# Patient Record
Sex: Male | Born: 1955
Health system: Southern US, Community
[De-identification: ages and names within clinical notes are randomized; demographics above are authoritative.]

## PROBLEM LIST (undated history)

## (undated) DIAGNOSIS — M1990 Unspecified osteoarthritis, unspecified site: Secondary | ICD-10-CM

## (undated) DIAGNOSIS — K759 Inflammatory liver disease, unspecified: Secondary | ICD-10-CM

## (undated) DIAGNOSIS — R0600 Dyspnea, unspecified: Secondary | ICD-10-CM

## (undated) DIAGNOSIS — E119 Type 2 diabetes mellitus without complications: Secondary | ICD-10-CM

## (undated) DIAGNOSIS — I10 Essential (primary) hypertension: Secondary | ICD-10-CM

## (undated) DIAGNOSIS — K219 Gastro-esophageal reflux disease without esophagitis: Secondary | ICD-10-CM

## (undated) DIAGNOSIS — Z87442 Personal history of urinary calculi: Secondary | ICD-10-CM

## (undated) DIAGNOSIS — J449 Chronic obstructive pulmonary disease, unspecified: Secondary | ICD-10-CM

## (undated) HISTORY — PX: CHOLECYSTECTOMY: SHX55

## (undated) HISTORY — PX: LIGAMENT REPAIR: SHX5444

## (undated) HISTORY — PX: KNEE ARTHROSCOPY: SHX127

---

## 1995-10-30 HISTORY — PX: NEUROPLASTY / TRANSPOSITION ULNAR NERVE AT ELBOW: SUR895

## 1999-10-30 HISTORY — PX: JOINT REPLACEMENT: SHX530

## 2000-05-15 ENCOUNTER — Encounter: Payer: Self-pay | Admitting: Specialist

## 2000-05-24 ENCOUNTER — Inpatient Hospital Stay (HOSPITAL_COMMUNITY): Admission: RE | Admit: 2000-05-24 | Discharge: 2000-05-27 | Payer: Self-pay | Admitting: Specialist

## 2000-05-24 ENCOUNTER — Encounter: Payer: Self-pay | Admitting: Specialist

## 2000-05-30 ENCOUNTER — Inpatient Hospital Stay (HOSPITAL_COMMUNITY): Admission: EM | Admit: 2000-05-30 | Discharge: 2000-05-31 | Payer: Self-pay | Admitting: Internal Medicine

## 2000-05-30 ENCOUNTER — Encounter: Payer: Self-pay | Admitting: Specialist

## 2000-06-03 ENCOUNTER — Encounter: Payer: Self-pay | Admitting: Specialist

## 2000-06-03 ENCOUNTER — Observation Stay (HOSPITAL_COMMUNITY): Admission: EM | Admit: 2000-06-03 | Discharge: 2000-06-04 | Payer: Self-pay | Admitting: Specialist

## 2000-10-29 HISTORY — PX: BONE GRAFT HIP ILIAC CREST: SUR159

## 2012-04-08 DIAGNOSIS — E538 Deficiency of other specified B group vitamins: Secondary | ICD-10-CM | POA: Diagnosis not present

## 2012-04-08 DIAGNOSIS — G619 Inflammatory polyneuropathy, unspecified: Secondary | ICD-10-CM | POA: Diagnosis not present

## 2012-04-08 DIAGNOSIS — N400 Enlarged prostate without lower urinary tract symptoms: Secondary | ICD-10-CM | POA: Diagnosis not present

## 2012-04-08 DIAGNOSIS — E119 Type 2 diabetes mellitus without complications: Secondary | ICD-10-CM | POA: Diagnosis not present

## 2012-04-08 DIAGNOSIS — M129 Arthropathy, unspecified: Secondary | ICD-10-CM | POA: Diagnosis not present

## 2012-04-10 DIAGNOSIS — M129 Arthropathy, unspecified: Secondary | ICD-10-CM | POA: Diagnosis not present

## 2012-04-10 DIAGNOSIS — G622 Polyneuropathy due to other toxic agents: Secondary | ICD-10-CM | POA: Diagnosis not present

## 2012-04-10 DIAGNOSIS — G2589 Other specified extrapyramidal and movement disorders: Secondary | ICD-10-CM | POA: Diagnosis not present

## 2012-04-10 DIAGNOSIS — E119 Type 2 diabetes mellitus without complications: Secondary | ICD-10-CM | POA: Diagnosis not present

## 2012-04-10 DIAGNOSIS — G619 Inflammatory polyneuropathy, unspecified: Secondary | ICD-10-CM | POA: Diagnosis not present

## 2012-09-29 DIAGNOSIS — L820 Inflamed seborrheic keratosis: Secondary | ICD-10-CM | POA: Diagnosis not present

## 2012-10-10 DIAGNOSIS — Z79899 Other long term (current) drug therapy: Secondary | ICD-10-CM | POA: Diagnosis not present

## 2012-10-10 DIAGNOSIS — E119 Type 2 diabetes mellitus without complications: Secondary | ICD-10-CM | POA: Diagnosis not present

## 2012-10-17 DIAGNOSIS — M129 Arthropathy, unspecified: Secondary | ICD-10-CM | POA: Diagnosis not present

## 2012-10-17 DIAGNOSIS — G2589 Other specified extrapyramidal and movement disorders: Secondary | ICD-10-CM | POA: Diagnosis not present

## 2012-10-17 DIAGNOSIS — G619 Inflammatory polyneuropathy, unspecified: Secondary | ICD-10-CM | POA: Diagnosis not present

## 2012-10-31 DIAGNOSIS — J45901 Unspecified asthma with (acute) exacerbation: Secondary | ICD-10-CM | POA: Diagnosis not present

## 2012-10-31 DIAGNOSIS — J019 Acute sinusitis, unspecified: Secondary | ICD-10-CM | POA: Diagnosis not present

## 2013-01-29 DIAGNOSIS — M23305 Other meniscus derangements, unspecified medial meniscus, unspecified knee: Secondary | ICD-10-CM | POA: Diagnosis not present

## 2013-01-29 DIAGNOSIS — M129 Arthropathy, unspecified: Secondary | ICD-10-CM | POA: Diagnosis not present

## 2013-01-29 DIAGNOSIS — J209 Acute bronchitis, unspecified: Secondary | ICD-10-CM | POA: Diagnosis not present

## 2013-04-07 DIAGNOSIS — G2589 Other specified extrapyramidal and movement disorders: Secondary | ICD-10-CM | POA: Diagnosis not present

## 2013-04-07 DIAGNOSIS — M129 Arthropathy, unspecified: Secondary | ICD-10-CM | POA: Diagnosis not present

## 2013-04-07 DIAGNOSIS — G619 Inflammatory polyneuropathy, unspecified: Secondary | ICD-10-CM | POA: Diagnosis not present

## 2013-04-14 DIAGNOSIS — G4700 Insomnia, unspecified: Secondary | ICD-10-CM | POA: Diagnosis not present

## 2013-04-14 DIAGNOSIS — E119 Type 2 diabetes mellitus without complications: Secondary | ICD-10-CM | POA: Diagnosis not present

## 2013-04-14 DIAGNOSIS — G622 Polyneuropathy due to other toxic agents: Secondary | ICD-10-CM | POA: Diagnosis not present

## 2013-04-14 DIAGNOSIS — M129 Arthropathy, unspecified: Secondary | ICD-10-CM | POA: Diagnosis not present

## 2013-04-14 DIAGNOSIS — G619 Inflammatory polyneuropathy, unspecified: Secondary | ICD-10-CM | POA: Diagnosis not present

## 2013-04-22 DIAGNOSIS — M129 Arthropathy, unspecified: Secondary | ICD-10-CM | POA: Diagnosis not present

## 2013-04-22 DIAGNOSIS — M23305 Other meniscus derangements, unspecified medial meniscus, unspecified knee: Secondary | ICD-10-CM | POA: Diagnosis not present

## 2013-09-04 DIAGNOSIS — F17200 Nicotine dependence, unspecified, uncomplicated: Secondary | ICD-10-CM | POA: Diagnosis not present

## 2013-09-04 DIAGNOSIS — J209 Acute bronchitis, unspecified: Secondary | ICD-10-CM | POA: Diagnosis not present

## 2013-09-04 DIAGNOSIS — S93409A Sprain of unspecified ligament of unspecified ankle, initial encounter: Secondary | ICD-10-CM | POA: Diagnosis not present

## 2013-09-04 DIAGNOSIS — J019 Acute sinusitis, unspecified: Secondary | ICD-10-CM | POA: Diagnosis not present

## 2013-10-20 DIAGNOSIS — E119 Type 2 diabetes mellitus without complications: Secondary | ICD-10-CM | POA: Diagnosis not present

## 2013-10-20 DIAGNOSIS — G619 Inflammatory polyneuropathy, unspecified: Secondary | ICD-10-CM | POA: Diagnosis not present

## 2013-11-09 DIAGNOSIS — M1990 Unspecified osteoarthritis, unspecified site: Secondary | ICD-10-CM | POA: Diagnosis not present

## 2013-11-09 DIAGNOSIS — G5620 Lesion of ulnar nerve, unspecified upper limb: Secondary | ICD-10-CM | POA: Diagnosis not present

## 2013-11-09 DIAGNOSIS — M25569 Pain in unspecified knee: Secondary | ICD-10-CM | POA: Diagnosis not present

## 2013-12-16 DIAGNOSIS — G5620 Lesion of ulnar nerve, unspecified upper limb: Secondary | ICD-10-CM | POA: Diagnosis not present

## 2013-12-16 DIAGNOSIS — Z79899 Other long term (current) drug therapy: Secondary | ICD-10-CM | POA: Diagnosis not present

## 2013-12-16 DIAGNOSIS — M1990 Unspecified osteoarthritis, unspecified site: Secondary | ICD-10-CM | POA: Diagnosis not present

## 2013-12-16 DIAGNOSIS — M25569 Pain in unspecified knee: Secondary | ICD-10-CM | POA: Diagnosis not present

## 2013-12-29 DIAGNOSIS — M722 Plantar fascial fibromatosis: Secondary | ICD-10-CM | POA: Diagnosis not present

## 2013-12-29 DIAGNOSIS — J45901 Unspecified asthma with (acute) exacerbation: Secondary | ICD-10-CM | POA: Diagnosis not present

## 2014-03-23 DIAGNOSIS — M1990 Unspecified osteoarthritis, unspecified site: Secondary | ICD-10-CM | POA: Diagnosis not present

## 2014-03-23 DIAGNOSIS — Z79899 Other long term (current) drug therapy: Secondary | ICD-10-CM | POA: Diagnosis not present

## 2014-03-23 DIAGNOSIS — M25569 Pain in unspecified knee: Secondary | ICD-10-CM | POA: Diagnosis not present

## 2014-03-23 DIAGNOSIS — G5620 Lesion of ulnar nerve, unspecified upper limb: Secondary | ICD-10-CM | POA: Diagnosis not present

## 2014-04-14 DIAGNOSIS — IMO0001 Reserved for inherently not codable concepts without codable children: Secondary | ICD-10-CM | POA: Diagnosis not present

## 2014-04-14 DIAGNOSIS — F17200 Nicotine dependence, unspecified, uncomplicated: Secondary | ICD-10-CM | POA: Diagnosis not present

## 2014-04-21 DIAGNOSIS — G622 Polyneuropathy due to other toxic agents: Secondary | ICD-10-CM | POA: Diagnosis not present

## 2014-04-21 DIAGNOSIS — F17200 Nicotine dependence, unspecified, uncomplicated: Secondary | ICD-10-CM | POA: Diagnosis not present

## 2014-04-21 DIAGNOSIS — E119 Type 2 diabetes mellitus without complications: Secondary | ICD-10-CM | POA: Diagnosis not present

## 2014-04-21 DIAGNOSIS — G619 Inflammatory polyneuropathy, unspecified: Secondary | ICD-10-CM | POA: Diagnosis not present

## 2014-08-04 DIAGNOSIS — M1990 Unspecified osteoarthritis, unspecified site: Secondary | ICD-10-CM | POA: Diagnosis not present

## 2014-08-04 DIAGNOSIS — Z79899 Other long term (current) drug therapy: Secondary | ICD-10-CM | POA: Diagnosis not present

## 2014-08-04 DIAGNOSIS — G5620 Lesion of ulnar nerve, unspecified upper limb: Secondary | ICD-10-CM | POA: Diagnosis not present

## 2014-08-04 DIAGNOSIS — M25569 Pain in unspecified knee: Secondary | ICD-10-CM | POA: Diagnosis not present

## 2014-08-21 DIAGNOSIS — M23203 Derangement of unspecified medial meniscus due to old tear or injury, right knee: Secondary | ICD-10-CM | POA: Diagnosis not present

## 2014-08-21 DIAGNOSIS — M179 Osteoarthritis of knee, unspecified: Secondary | ICD-10-CM | POA: Diagnosis not present

## 2014-10-08 DIAGNOSIS — J209 Acute bronchitis, unspecified: Secondary | ICD-10-CM | POA: Diagnosis not present

## 2014-10-08 DIAGNOSIS — J0111 Acute recurrent frontal sinusitis: Secondary | ICD-10-CM | POA: Diagnosis not present

## 2014-10-13 DIAGNOSIS — G259 Extrapyramidal and movement disorder, unspecified: Secondary | ICD-10-CM | POA: Diagnosis not present

## 2014-10-13 DIAGNOSIS — Z720 Tobacco use: Secondary | ICD-10-CM | POA: Diagnosis not present

## 2014-10-13 DIAGNOSIS — E119 Type 2 diabetes mellitus without complications: Secondary | ICD-10-CM | POA: Diagnosis not present

## 2014-10-13 DIAGNOSIS — M1990 Unspecified osteoarthritis, unspecified site: Secondary | ICD-10-CM | POA: Diagnosis not present

## 2014-10-13 DIAGNOSIS — G619 Inflammatory polyneuropathy, unspecified: Secondary | ICD-10-CM | POA: Diagnosis not present

## 2014-10-20 DIAGNOSIS — E119 Type 2 diabetes mellitus without complications: Secondary | ICD-10-CM | POA: Diagnosis not present

## 2014-10-20 DIAGNOSIS — Z720 Tobacco use: Secondary | ICD-10-CM | POA: Diagnosis not present

## 2014-10-20 DIAGNOSIS — G619 Inflammatory polyneuropathy, unspecified: Secondary | ICD-10-CM | POA: Diagnosis not present

## 2014-10-20 DIAGNOSIS — M1990 Unspecified osteoarthritis, unspecified site: Secondary | ICD-10-CM | POA: Diagnosis not present

## 2014-11-30 DIAGNOSIS — Z79891 Long term (current) use of opiate analgesic: Secondary | ICD-10-CM | POA: Diagnosis not present

## 2015-01-12 DIAGNOSIS — M25569 Pain in unspecified knee: Secondary | ICD-10-CM | POA: Diagnosis not present

## 2015-01-12 DIAGNOSIS — M47816 Spondylosis without myelopathy or radiculopathy, lumbar region: Secondary | ICD-10-CM | POA: Diagnosis not present

## 2015-01-12 DIAGNOSIS — G5620 Lesion of ulnar nerve, unspecified upper limb: Secondary | ICD-10-CM | POA: Diagnosis not present

## 2015-01-12 DIAGNOSIS — M1990 Unspecified osteoarthritis, unspecified site: Secondary | ICD-10-CM | POA: Diagnosis not present

## 2015-04-07 DIAGNOSIS — E119 Type 2 diabetes mellitus without complications: Secondary | ICD-10-CM | POA: Diagnosis not present

## 2015-04-12 DIAGNOSIS — G619 Inflammatory polyneuropathy, unspecified: Secondary | ICD-10-CM | POA: Diagnosis not present

## 2015-04-12 DIAGNOSIS — Z720 Tobacco use: Secondary | ICD-10-CM | POA: Diagnosis not present

## 2015-04-12 DIAGNOSIS — Z1389 Encounter for screening for other disorder: Secondary | ICD-10-CM | POA: Diagnosis not present

## 2015-04-12 DIAGNOSIS — E119 Type 2 diabetes mellitus without complications: Secondary | ICD-10-CM | POA: Diagnosis not present

## 2015-05-09 DIAGNOSIS — G5620 Lesion of ulnar nerve, unspecified upper limb: Secondary | ICD-10-CM | POA: Diagnosis not present

## 2015-05-09 DIAGNOSIS — M47816 Spondylosis without myelopathy or radiculopathy, lumbar region: Secondary | ICD-10-CM | POA: Diagnosis not present

## 2015-05-09 DIAGNOSIS — M1990 Unspecified osteoarthritis, unspecified site: Secondary | ICD-10-CM | POA: Diagnosis not present

## 2015-09-05 DIAGNOSIS — M1990 Unspecified osteoarthritis, unspecified site: Secondary | ICD-10-CM | POA: Diagnosis not present

## 2015-09-05 DIAGNOSIS — M47816 Spondylosis without myelopathy or radiculopathy, lumbar region: Secondary | ICD-10-CM | POA: Diagnosis not present

## 2015-10-04 DIAGNOSIS — E119 Type 2 diabetes mellitus without complications: Secondary | ICD-10-CM | POA: Diagnosis not present

## 2015-10-04 DIAGNOSIS — G619 Inflammatory polyneuropathy, unspecified: Secondary | ICD-10-CM | POA: Diagnosis not present

## 2015-10-04 DIAGNOSIS — Z720 Tobacco use: Secondary | ICD-10-CM | POA: Diagnosis not present

## 2015-11-02 DIAGNOSIS — R05 Cough: Secondary | ICD-10-CM | POA: Diagnosis not present

## 2015-11-02 DIAGNOSIS — R0981 Nasal congestion: Secondary | ICD-10-CM | POA: Diagnosis not present

## 2016-01-05 DIAGNOSIS — Z79891 Long term (current) use of opiate analgesic: Secondary | ICD-10-CM | POA: Diagnosis not present

## 2016-01-18 DIAGNOSIS — M1990 Unspecified osteoarthritis, unspecified site: Secondary | ICD-10-CM | POA: Diagnosis not present

## 2016-01-18 DIAGNOSIS — Z79899 Other long term (current) drug therapy: Secondary | ICD-10-CM | POA: Diagnosis not present

## 2016-01-18 DIAGNOSIS — G5620 Lesion of ulnar nerve, unspecified upper limb: Secondary | ICD-10-CM | POA: Diagnosis not present

## 2016-01-18 DIAGNOSIS — M25569 Pain in unspecified knee: Secondary | ICD-10-CM | POA: Diagnosis not present

## 2016-03-28 DIAGNOSIS — M25461 Effusion, right knee: Secondary | ICD-10-CM | POA: Diagnosis not present

## 2016-03-28 DIAGNOSIS — M23203 Derangement of unspecified medial meniscus due to old tear or injury, right knee: Secondary | ICD-10-CM | POA: Diagnosis not present

## 2016-03-28 DIAGNOSIS — D1621 Benign neoplasm of long bones of right lower limb: Secondary | ICD-10-CM | POA: Diagnosis not present

## 2016-03-29 DIAGNOSIS — M1990 Unspecified osteoarthritis, unspecified site: Secondary | ICD-10-CM | POA: Diagnosis not present

## 2016-03-29 DIAGNOSIS — E119 Type 2 diabetes mellitus without complications: Secondary | ICD-10-CM | POA: Diagnosis not present

## 2016-03-29 DIAGNOSIS — Z720 Tobacco use: Secondary | ICD-10-CM | POA: Diagnosis not present

## 2016-03-29 DIAGNOSIS — G259 Extrapyramidal and movement disorder, unspecified: Secondary | ICD-10-CM | POA: Diagnosis not present

## 2016-04-03 DIAGNOSIS — M859 Disorder of bone density and structure, unspecified: Secondary | ICD-10-CM | POA: Diagnosis not present

## 2016-04-03 DIAGNOSIS — D1621 Benign neoplasm of long bones of right lower limb: Secondary | ICD-10-CM | POA: Diagnosis not present

## 2016-04-05 DIAGNOSIS — Z720 Tobacco use: Secondary | ICD-10-CM | POA: Diagnosis not present

## 2016-04-05 DIAGNOSIS — M1990 Unspecified osteoarthritis, unspecified site: Secondary | ICD-10-CM | POA: Diagnosis not present

## 2016-04-05 DIAGNOSIS — E119 Type 2 diabetes mellitus without complications: Secondary | ICD-10-CM | POA: Diagnosis not present

## 2016-04-05 DIAGNOSIS — G619 Inflammatory polyneuropathy, unspecified: Secondary | ICD-10-CM | POA: Diagnosis not present

## 2016-06-26 DIAGNOSIS — M25775 Osteophyte, left foot: Secondary | ICD-10-CM | POA: Diagnosis not present

## 2016-06-26 DIAGNOSIS — L600 Ingrowing nail: Secondary | ICD-10-CM | POA: Diagnosis not present

## 2016-07-25 DIAGNOSIS — G8929 Other chronic pain: Secondary | ICD-10-CM | POA: Diagnosis not present

## 2016-07-25 DIAGNOSIS — Z79899 Other long term (current) drug therapy: Secondary | ICD-10-CM | POA: Diagnosis not present

## 2016-07-25 DIAGNOSIS — M25522 Pain in left elbow: Secondary | ICD-10-CM | POA: Diagnosis not present

## 2016-08-20 DIAGNOSIS — Z79891 Long term (current) use of opiate analgesic: Secondary | ICD-10-CM | POA: Diagnosis not present

## 2016-08-20 DIAGNOSIS — M25522 Pain in left elbow: Secondary | ICD-10-CM | POA: Diagnosis not present

## 2016-08-20 DIAGNOSIS — G8929 Other chronic pain: Secondary | ICD-10-CM | POA: Diagnosis not present

## 2016-09-17 DIAGNOSIS — Z79891 Long term (current) use of opiate analgesic: Secondary | ICD-10-CM | POA: Diagnosis not present

## 2016-09-17 DIAGNOSIS — M25522 Pain in left elbow: Secondary | ICD-10-CM | POA: Diagnosis not present

## 2016-09-17 DIAGNOSIS — G8929 Other chronic pain: Secondary | ICD-10-CM | POA: Diagnosis not present

## 2016-10-15 DIAGNOSIS — Z79891 Long term (current) use of opiate analgesic: Secondary | ICD-10-CM | POA: Diagnosis not present

## 2016-10-15 DIAGNOSIS — G8929 Other chronic pain: Secondary | ICD-10-CM | POA: Diagnosis not present

## 2016-10-15 DIAGNOSIS — M25522 Pain in left elbow: Secondary | ICD-10-CM | POA: Diagnosis not present

## 2016-11-19 DIAGNOSIS — Z79891 Long term (current) use of opiate analgesic: Secondary | ICD-10-CM | POA: Diagnosis not present

## 2016-11-19 DIAGNOSIS — M25522 Pain in left elbow: Secondary | ICD-10-CM | POA: Diagnosis not present

## 2016-11-19 DIAGNOSIS — G8929 Other chronic pain: Secondary | ICD-10-CM | POA: Diagnosis not present

## 2017-01-09 DIAGNOSIS — M25461 Effusion, right knee: Secondary | ICD-10-CM | POA: Diagnosis not present

## 2017-01-09 DIAGNOSIS — M5416 Radiculopathy, lumbar region: Secondary | ICD-10-CM | POA: Diagnosis not present

## 2017-01-09 DIAGNOSIS — Z6827 Body mass index (BMI) 27.0-27.9, adult: Secondary | ICD-10-CM | POA: Diagnosis not present

## 2017-01-09 DIAGNOSIS — M23203 Derangement of unspecified medial meniscus due to old tear or injury, right knee: Secondary | ICD-10-CM | POA: Diagnosis not present

## 2017-01-14 DIAGNOSIS — M150 Primary generalized (osteo)arthritis: Secondary | ICD-10-CM | POA: Diagnosis not present

## 2017-01-14 DIAGNOSIS — E1142 Type 2 diabetes mellitus with diabetic polyneuropathy: Secondary | ICD-10-CM | POA: Diagnosis not present

## 2017-01-14 DIAGNOSIS — Z79891 Long term (current) use of opiate analgesic: Secondary | ICD-10-CM | POA: Diagnosis not present

## 2017-01-14 DIAGNOSIS — G5622 Lesion of ulnar nerve, left upper limb: Secondary | ICD-10-CM | POA: Diagnosis not present

## 2017-01-14 DIAGNOSIS — M47817 Spondylosis without myelopathy or radiculopathy, lumbosacral region: Secondary | ICD-10-CM | POA: Diagnosis not present

## 2017-01-14 DIAGNOSIS — G894 Chronic pain syndrome: Secondary | ICD-10-CM | POA: Diagnosis not present

## 2017-02-13 DIAGNOSIS — Z79891 Long term (current) use of opiate analgesic: Secondary | ICD-10-CM | POA: Diagnosis not present

## 2017-03-04 DIAGNOSIS — Z720 Tobacco use: Secondary | ICD-10-CM | POA: Diagnosis not present

## 2017-03-04 DIAGNOSIS — Z6827 Body mass index (BMI) 27.0-27.9, adult: Secondary | ICD-10-CM | POA: Diagnosis not present

## 2017-03-04 DIAGNOSIS — J209 Acute bronchitis, unspecified: Secondary | ICD-10-CM | POA: Diagnosis not present

## 2017-03-04 DIAGNOSIS — J0111 Acute recurrent frontal sinusitis: Secondary | ICD-10-CM | POA: Diagnosis not present

## 2017-03-19 DIAGNOSIS — Z79891 Long term (current) use of opiate analgesic: Secondary | ICD-10-CM | POA: Diagnosis not present

## 2017-03-19 DIAGNOSIS — M47817 Spondylosis without myelopathy or radiculopathy, lumbosacral region: Secondary | ICD-10-CM | POA: Diagnosis not present

## 2017-03-19 DIAGNOSIS — E1142 Type 2 diabetes mellitus with diabetic polyneuropathy: Secondary | ICD-10-CM | POA: Diagnosis not present

## 2017-03-19 DIAGNOSIS — M150 Primary generalized (osteo)arthritis: Secondary | ICD-10-CM | POA: Diagnosis not present

## 2017-03-19 DIAGNOSIS — G5622 Lesion of ulnar nerve, left upper limb: Secondary | ICD-10-CM | POA: Diagnosis not present

## 2017-03-19 DIAGNOSIS — G8929 Other chronic pain: Secondary | ICD-10-CM | POA: Diagnosis not present

## 2017-04-04 DIAGNOSIS — G619 Inflammatory polyneuropathy, unspecified: Secondary | ICD-10-CM | POA: Diagnosis not present

## 2017-04-04 DIAGNOSIS — E119 Type 2 diabetes mellitus without complications: Secondary | ICD-10-CM | POA: Diagnosis not present

## 2017-04-04 DIAGNOSIS — D519 Vitamin B12 deficiency anemia, unspecified: Secondary | ICD-10-CM | POA: Diagnosis not present

## 2017-04-04 DIAGNOSIS — M1990 Unspecified osteoarthritis, unspecified site: Secondary | ICD-10-CM | POA: Diagnosis not present

## 2017-04-04 DIAGNOSIS — Z720 Tobacco use: Secondary | ICD-10-CM | POA: Diagnosis not present

## 2017-04-08 DIAGNOSIS — Z6827 Body mass index (BMI) 27.0-27.9, adult: Secondary | ICD-10-CM | POA: Diagnosis not present

## 2017-04-08 DIAGNOSIS — M1711 Unilateral primary osteoarthritis, right knee: Secondary | ICD-10-CM | POA: Diagnosis not present

## 2017-04-08 DIAGNOSIS — M23203 Derangement of unspecified medial meniscus due to old tear or injury, right knee: Secondary | ICD-10-CM | POA: Diagnosis not present

## 2017-04-09 DIAGNOSIS — Z0001 Encounter for general adult medical examination with abnormal findings: Secondary | ICD-10-CM | POA: Diagnosis not present

## 2017-04-09 DIAGNOSIS — E119 Type 2 diabetes mellitus without complications: Secondary | ICD-10-CM | POA: Diagnosis not present

## 2017-04-09 DIAGNOSIS — E7800 Pure hypercholesterolemia, unspecified: Secondary | ICD-10-CM | POA: Diagnosis not present

## 2017-04-09 DIAGNOSIS — M545 Low back pain: Secondary | ICD-10-CM | POA: Diagnosis not present

## 2017-04-09 DIAGNOSIS — Z6826 Body mass index (BMI) 26.0-26.9, adult: Secondary | ICD-10-CM | POA: Diagnosis not present

## 2017-06-11 DIAGNOSIS — M545 Low back pain: Secondary | ICD-10-CM | POA: Diagnosis not present

## 2017-06-11 DIAGNOSIS — M25522 Pain in left elbow: Secondary | ICD-10-CM | POA: Diagnosis not present

## 2017-06-11 DIAGNOSIS — Z79899 Other long term (current) drug therapy: Secondary | ICD-10-CM | POA: Diagnosis not present

## 2017-06-11 DIAGNOSIS — M79631 Pain in right forearm: Secondary | ICD-10-CM | POA: Diagnosis not present

## 2017-06-29 DIAGNOSIS — E119 Type 2 diabetes mellitus without complications: Secondary | ICD-10-CM | POA: Diagnosis not present

## 2017-06-29 DIAGNOSIS — L255 Unspecified contact dermatitis due to plants, except food: Secondary | ICD-10-CM | POA: Diagnosis not present

## 2017-06-29 DIAGNOSIS — Z6828 Body mass index (BMI) 28.0-28.9, adult: Secondary | ICD-10-CM | POA: Diagnosis not present

## 2017-07-09 DIAGNOSIS — M545 Low back pain: Secondary | ICD-10-CM | POA: Diagnosis not present

## 2017-07-09 DIAGNOSIS — M25551 Pain in right hip: Secondary | ICD-10-CM | POA: Diagnosis not present

## 2017-07-09 DIAGNOSIS — Z6828 Body mass index (BMI) 28.0-28.9, adult: Secondary | ICD-10-CM | POA: Diagnosis not present

## 2017-07-15 DIAGNOSIS — M79631 Pain in right forearm: Secondary | ICD-10-CM | POA: Diagnosis not present

## 2017-07-15 DIAGNOSIS — M545 Low back pain: Secondary | ICD-10-CM | POA: Diagnosis not present

## 2017-07-15 DIAGNOSIS — E119 Type 2 diabetes mellitus without complications: Secondary | ICD-10-CM | POA: Diagnosis not present

## 2017-07-15 DIAGNOSIS — M25522 Pain in left elbow: Secondary | ICD-10-CM | POA: Diagnosis not present

## 2017-07-15 DIAGNOSIS — Z79899 Other long term (current) drug therapy: Secondary | ICD-10-CM | POA: Diagnosis not present

## 2017-08-13 DIAGNOSIS — Z79899 Other long term (current) drug therapy: Secondary | ICD-10-CM | POA: Diagnosis not present

## 2017-08-13 DIAGNOSIS — M545 Low back pain: Secondary | ICD-10-CM | POA: Diagnosis not present

## 2017-08-16 DIAGNOSIS — Z6827 Body mass index (BMI) 27.0-27.9, adult: Secondary | ICD-10-CM | POA: Diagnosis not present

## 2017-08-16 DIAGNOSIS — A084 Viral intestinal infection, unspecified: Secondary | ICD-10-CM | POA: Diagnosis not present

## 2017-08-29 DIAGNOSIS — Z1389 Encounter for screening for other disorder: Secondary | ICD-10-CM | POA: Diagnosis not present

## 2017-08-29 DIAGNOSIS — M25551 Pain in right hip: Secondary | ICD-10-CM | POA: Diagnosis not present

## 2017-08-29 DIAGNOSIS — Z6828 Body mass index (BMI) 28.0-28.9, adult: Secondary | ICD-10-CM | POA: Diagnosis not present

## 2017-08-29 DIAGNOSIS — M545 Low back pain: Secondary | ICD-10-CM | POA: Diagnosis not present

## 2017-09-10 DIAGNOSIS — M25551 Pain in right hip: Secondary | ICD-10-CM | POA: Diagnosis not present

## 2017-09-10 DIAGNOSIS — M5416 Radiculopathy, lumbar region: Secondary | ICD-10-CM | POA: Diagnosis not present

## 2017-09-10 DIAGNOSIS — Z96641 Presence of right artificial hip joint: Secondary | ICD-10-CM | POA: Diagnosis not present

## 2017-09-17 DIAGNOSIS — M5416 Radiculopathy, lumbar region: Secondary | ICD-10-CM | POA: Diagnosis not present

## 2017-09-23 DIAGNOSIS — M5416 Radiculopathy, lumbar region: Secondary | ICD-10-CM | POA: Diagnosis not present

## 2017-10-11 DIAGNOSIS — M5416 Radiculopathy, lumbar region: Secondary | ICD-10-CM | POA: Diagnosis not present

## 2017-10-11 DIAGNOSIS — M48061 Spinal stenosis, lumbar region without neurogenic claudication: Secondary | ICD-10-CM | POA: Diagnosis not present

## 2017-10-14 ENCOUNTER — Other Ambulatory Visit: Payer: Self-pay | Admitting: Orthopedic Surgery

## 2017-10-14 DIAGNOSIS — M5416 Radiculopathy, lumbar region: Secondary | ICD-10-CM

## 2017-10-30 ENCOUNTER — Ambulatory Visit
Admission: RE | Admit: 2017-10-30 | Discharge: 2017-10-30 | Disposition: A | Discharge status: Home / Self Care | Payer: Medicare Other | Source: Ambulatory Visit | Attending: Orthopedic Surgery | Admitting: Orthopedic Surgery

## 2017-10-30 ENCOUNTER — Other Ambulatory Visit: Payer: Self-pay | Admitting: Orthopedic Surgery

## 2017-10-30 ENCOUNTER — Ambulatory Visit
Admission: RE | Admit: 2017-10-30 | Discharge: 2017-10-30 | Disposition: A | Discharge status: Home / Self Care | Payer: Self-pay | Source: Ambulatory Visit | Attending: Orthopedic Surgery | Admitting: Orthopedic Surgery

## 2017-10-30 DIAGNOSIS — M5416 Radiculopathy, lumbar region: Secondary | ICD-10-CM

## 2017-10-30 DIAGNOSIS — R52 Pain, unspecified: Secondary | ICD-10-CM

## 2017-10-30 DIAGNOSIS — M5126 Other intervertebral disc displacement, lumbar region: Secondary | ICD-10-CM | POA: Diagnosis not present

## 2017-10-30 MED ORDER — DIAZEPAM 5 MG PO TABS
10.0000 mg | ORAL_TABLET | Freq: Once | ORAL | Status: AC
  Administered 2017-10-30: 10 mg via ORAL

## 2017-10-30 MED ORDER — IOPAMIDOL (ISOVUE-M 200) INJECTION 41%
15.0000 mL | Freq: Once | INTRAMUSCULAR | Status: AC
  Administered 2017-10-30: 15 mL via INTRATHECAL

## 2017-10-30 NOTE — Discharge Instructions (Signed)
Myelogram Discharge Instructions  1. Go home and rest quietly for the next 24 hours.  It is important to lie flat for the next 24 hours.  Get up only to go to the restroom.  You may lie in the bed or on a couch on your back, your stomach, your left side or your right side.  You may have one pillow under your head.  You may have pillows between your knees while you are on your side or under your knees while you are on your back.  2. DO NOT drive today.  Recline the seat as far back as it will go, while still wearing your seat belt, on the way home.  3. You may get up to go to the bathroom as needed.  You may sit up for 10 minutes to eat.  You may resume your normal diet and medications unless otherwise indicated.  Drink lots of extra fluids today and tomorrow.  4. The incidence of headache, nausea, or vomiting is about 5% (one in 20 patients).  If you develop a headache, lie flat and drink plenty of fluids until the headache goes away.  Caffeinated beverages may be helpful.  If you develop severe nausea and vomiting or a headache that does not go away with flat bed rest, call [phone].  5. You may resume normal activities after your 24 hours of bed rest is over; however, do not exert yourself strongly or do any heavy lifting tomorrow. If when you get up you have a headache when standing, go back to bed and force fluids for another 24 hours.  6. Call your physician for a follow-up appointment.  The results of your myelogram will be sent directly to your physician by the following day.  7. If you have any questions or if complications develop after you arrive home, please call [phone].  Discharge instructions have been explained to the patient.  The patient, or the person responsible for the patient, fully understands these instructions  YOU MAY RESTART YOUR TRAMADOL TOMORROW 10/31/2016 AT 10:30AM.

## 2017-11-04 DIAGNOSIS — M48061 Spinal stenosis, lumbar region without neurogenic claudication: Secondary | ICD-10-CM | POA: Diagnosis not present

## 2017-11-19 DIAGNOSIS — M47816 Spondylosis without myelopathy or radiculopathy, lumbar region: Secondary | ICD-10-CM | POA: Diagnosis not present

## 2017-12-03 DIAGNOSIS — M7138 Other bursal cyst, other site: Secondary | ICD-10-CM | POA: Diagnosis not present

## 2017-12-16 DIAGNOSIS — M5416 Radiculopathy, lumbar region: Secondary | ICD-10-CM | POA: Diagnosis not present

## 2017-12-16 DIAGNOSIS — M48061 Spinal stenosis, lumbar region without neurogenic claudication: Secondary | ICD-10-CM | POA: Diagnosis not present

## 2017-12-16 DIAGNOSIS — G5622 Lesion of ulnar nerve, left upper limb: Secondary | ICD-10-CM | POA: Diagnosis not present

## 2017-12-16 DIAGNOSIS — G894 Chronic pain syndrome: Secondary | ICD-10-CM | POA: Diagnosis not present

## 2017-12-26 DIAGNOSIS — M5416 Radiculopathy, lumbar region: Secondary | ICD-10-CM | POA: Diagnosis not present

## 2017-12-31 DIAGNOSIS — M5416 Radiculopathy, lumbar region: Secondary | ICD-10-CM | POA: Diagnosis not present

## 2017-12-31 DIAGNOSIS — M48061 Spinal stenosis, lumbar region without neurogenic claudication: Secondary | ICD-10-CM | POA: Diagnosis not present

## 2018-01-21 DIAGNOSIS — M48061 Spinal stenosis, lumbar region without neurogenic claudication: Secondary | ICD-10-CM | POA: Diagnosis not present

## 2018-01-21 DIAGNOSIS — M5416 Radiculopathy, lumbar region: Secondary | ICD-10-CM | POA: Diagnosis not present

## 2018-02-18 DIAGNOSIS — F17200 Nicotine dependence, unspecified, uncomplicated: Secondary | ICD-10-CM | POA: Diagnosis not present

## 2018-02-18 DIAGNOSIS — J449 Chronic obstructive pulmonary disease, unspecified: Secondary | ICD-10-CM | POA: Diagnosis not present

## 2018-02-18 DIAGNOSIS — I10 Essential (primary) hypertension: Secondary | ICD-10-CM | POA: Diagnosis not present

## 2018-02-18 DIAGNOSIS — M545 Low back pain: Secondary | ICD-10-CM | POA: Diagnosis not present

## 2018-02-24 DIAGNOSIS — M5416 Radiculopathy, lumbar region: Secondary | ICD-10-CM | POA: Diagnosis not present

## 2018-02-25 ENCOUNTER — Ambulatory Visit (HOSPITAL_COMMUNITY)
Admission: RE | Admit: 2018-02-25 | Discharge: 2018-02-25 | Disposition: A | Discharge status: Home / Self Care | Payer: Medicare Other | Source: Ambulatory Visit | Attending: Pulmonary Disease | Admitting: Pulmonary Disease

## 2018-02-25 ENCOUNTER — Other Ambulatory Visit (HOSPITAL_COMMUNITY): Payer: Self-pay | Admitting: Pulmonary Disease

## 2018-02-25 DIAGNOSIS — R0602 Shortness of breath: Secondary | ICD-10-CM | POA: Diagnosis not present

## 2018-02-25 DIAGNOSIS — R05 Cough: Secondary | ICD-10-CM | POA: Diagnosis not present

## 2018-02-25 DIAGNOSIS — J449 Chronic obstructive pulmonary disease, unspecified: Secondary | ICD-10-CM

## 2018-03-05 NOTE — Patient Instructions (Addendum)
ROCH QUACH  03/05/2018   Your procedure is scheduled on: 03-12-18  Report to [facility] Main  Entrance    Report to admitting at 6:30 AM    Call this number if you have problems the morning of surgery [phone]   Remember: Do not eat food or drink liquids :After Midnight.     Take these medicines the morning of surgery with A SIP OF WATER: None. You may bring and use your inhaler as needed.   DO NOT TAKE ANY DIABETIC MEDICATIONS DAY OF YOUR SURGERY                               You may not have any metal on your body including hair pins and              piercings  Do not wear jewelry, lotions, powders or  deodorant             Men may shave face and neck.   Do not bring valuables to the hospital. [place].  Contacts, dentures or bridgework may not be worn into surgery.  Leave suitcase in the car. After surgery it may be brought to your room.    Special Instructions: N/A              Please read over the following fact sheets you were given: _____________________________________________________________________ How to Manage Your Diabetes Before and After Surgery  Why is it important to control my blood sugar before and after surgery? . Improving blood sugar levels before and after surgery helps healing and can limit problems. . A way of improving blood sugar control is eating a healthy diet by: o  Eating less sugar and carbohydrates o  Increasing activity/exercise o  Talking with your doctor about reaching your blood sugar goals . High blood sugars (greater than 180 mg/dL) can raise your risk of infections and slow your recovery, so you will need to focus on controlling your diabetes during the weeks before surgery. . Make sure that the doctor who takes care of your diabetes knows about your planned surgery including the date and location.  How do I manage my blood sugar before  surgery? . Check your blood sugar at least 4 times a day, starting 2 days before surgery, to make sure that the level is not too high or low. o Check your blood sugar the morning of your surgery when you wake up and every 2 hours until you get to the Short Stay unit. . If your blood sugar is less than 70 mg/dL, you will need to treat for low blood sugar: o Do not take insulin. o Treat a low blood sugar (less than 70 mg/dL) with  cup of clear juice (cranberry or apple), 4 glucose tablets, OR glucose gel. o Recheck blood sugar in 15 minutes after treatment (to make sure it is greater than 70 mg/dL). If your blood sugar is not greater than 70 mg/dL on recheck, call [phone] for further instructions. . Report your blood sugar to the short stay nurse when you get to Short Stay.  . If you are admitted to the hospital after surgery: o Your blood sugar will be checked by the staff and you will  probably be given insulin after surgery (instead of oral diabetes medicines) to make sure you have good blood sugar levels. o The goal for blood sugar control after surgery is 80-180 mg/dL.   WHAT DO I DO ABOUT MY DIABETES MEDICATION?  Marland Kitchen Do not take oral diabetes medicines (pills) the morning of surgery.  . THE DAY BEFORE SURGERY, take your usual dose of Metformin. Take only your morning/lunch dose of Glipizide.       Reviewed and Endorsed by [facility] Patient Education Committee, August 2015           [facility] - Preparing for Surgery Before surgery, you can play an important role.  Because skin is not sterile, your skin needs to be as free of germs as possible.  You can reduce the number of germs on your skin by washing with CHG (chlorahexidine gluconate) soap before surgery.  CHG is an antiseptic cleaner which kills germs and bonds with the skin to continue killing germs even after washing. Please DO NOT use if you have an allergy to CHG or antibacterial soaps.  If your skin becomes  reddened/irritated stop using the CHG and inform your nurse when you arrive at Short Stay. Do not shave (including legs and underarms) for at least 48 hours prior to the first CHG shower.  You may shave your face/neck. Please follow these instructions carefully:  1.  Shower with CHG Soap the night before surgery and the  morning of Surgery.  2.  If you choose to wash your hair, wash your hair first as usual with your  normal  shampoo.  3.  After you shampoo, rinse your hair and body thoroughly to remove the  shampoo.                           4.  Use CHG as you would any other liquid soap.  You can apply chg directly  to the skin and wash                       Gently with a scrungie or clean washcloth.  5.  Apply the CHG Soap to your body ONLY FROM THE NECK DOWN.   Do not use on face/ open                           Wound or open sores. Avoid contact with eyes, ears mouth and genitals (private parts).                       Wash face,  Genitals (private parts) with your normal soap.             6.  Wash thoroughly, paying special attention to the area where your surgery  will be performed.  7.  Thoroughly rinse your body with warm water from the neck down.  8.  DO NOT shower/wash with your normal soap after using and rinsing off  the CHG Soap.                9.  Pat yourself dry with a clean towel.            10.  Wear clean pajamas.            11.  Place clean sheets on your bed the night of your first shower and do not  sleep with pets.  Day of Surgery : Do not apply any lotions/deodorants the morning of surgery.  Please wear clean clothes to the hospital/surgery center.  FAILURE TO FOLLOW THESE INSTRUCTIONS MAY RESULT IN THE CANCELLATION OF YOUR SURGERY PATIENT SIGNATURE_________________________________  NURSE SIGNATURE__________________________________  ________________________________________________________________________

## 2018-03-05 NOTE — Progress Notes (Signed)
02-25-18 (Epic) CXR

## 2018-03-06 ENCOUNTER — Ambulatory Visit (HOSPITAL_COMMUNITY)
Admission: RE | Admit: 2018-03-06 | Discharge: 2018-03-06 | Disposition: A | Discharge status: Home / Self Care | Payer: Medicare Other | Source: Ambulatory Visit | Attending: Surgical | Admitting: Surgical

## 2018-03-06 ENCOUNTER — Encounter (HOSPITAL_COMMUNITY)
Admission: RE | Admit: 2018-03-06 | Discharge: 2018-03-06 | Disposition: A | Discharge status: Home / Self Care | Payer: Medicare Other | Source: Ambulatory Visit | Attending: Orthopedic Surgery | Admitting: Orthopedic Surgery

## 2018-03-06 ENCOUNTER — Other Ambulatory Visit: Payer: Self-pay

## 2018-03-06 ENCOUNTER — Encounter (HOSPITAL_COMMUNITY): Payer: Self-pay

## 2018-03-06 DIAGNOSIS — M545 Low back pain, unspecified: Secondary | ICD-10-CM

## 2018-03-06 DIAGNOSIS — Z01810 Encounter for preprocedural cardiovascular examination: Secondary | ICD-10-CM | POA: Diagnosis not present

## 2018-03-06 DIAGNOSIS — I10 Essential (primary) hypertension: Secondary | ICD-10-CM | POA: Diagnosis not present

## 2018-03-06 DIAGNOSIS — M47816 Spondylosis without myelopathy or radiculopathy, lumbar region: Secondary | ICD-10-CM | POA: Diagnosis not present

## 2018-03-06 DIAGNOSIS — Z01812 Encounter for preprocedural laboratory examination: Secondary | ICD-10-CM | POA: Insufficient documentation

## 2018-03-06 HISTORY — DX: Personal history of urinary calculi: Z87.442

## 2018-03-06 HISTORY — DX: Unspecified osteoarthritis, unspecified site: M19.90

## 2018-03-06 HISTORY — DX: Chronic obstructive pulmonary disease, unspecified: J44.9

## 2018-03-06 HISTORY — DX: Type 2 diabetes mellitus without complications: E11.9

## 2018-03-06 HISTORY — DX: Dyspnea, unspecified: R06.00

## 2018-03-06 HISTORY — DX: Essential (primary) hypertension: I10

## 2018-03-06 HISTORY — DX: Inflammatory liver disease, unspecified: K75.9

## 2018-03-06 HISTORY — DX: Gastro-esophageal reflux disease without esophagitis: K21.9

## 2018-03-06 LAB — URINALYSIS, ROUTINE W REFLEX MICROSCOPIC
Bilirubin Urine: NEGATIVE
Glucose, UA: NEGATIVE mg/dL
Hgb urine dipstick: NEGATIVE
Ketones, ur: NEGATIVE mg/dL
Leukocytes, UA: NEGATIVE
Nitrite: NEGATIVE
Protein, ur: NEGATIVE mg/dL
Specific Gravity, Urine: 1.016 (ref 1.005–1.030)
pH: 7 (ref 5.0–8.0)

## 2018-03-06 LAB — CBC WITH DIFFERENTIAL/PLATELET
Basophils Absolute: 0 10*3/uL (ref 0.0–0.1)
Basophils Relative: 1 %
Eosinophils Absolute: 0.3 10*3/uL (ref 0.0–0.7)
Eosinophils Relative: 3 %
HCT: 44.8 % (ref 39.0–52.0)
Hemoglobin: 14.6 g/dL (ref 13.0–17.0)
Lymphocytes Relative: 32 %
Lymphs Abs: 2.6 10*3/uL (ref 0.7–4.0)
MCH: 28.3 pg (ref 26.0–34.0)
MCHC: 32.6 g/dL (ref 30.0–36.0)
MCV: 87 fL (ref 78.0–100.0)
Monocytes Absolute: 0.4 10*3/uL (ref 0.1–1.0)
Monocytes Relative: 5 %
Neutro Abs: 4.8 10*3/uL (ref 1.7–7.7)
Neutrophils Relative %: 59 %
Platelets: 233 10*3/uL (ref 150–400)
RBC: 5.15 MIL/uL (ref 4.22–5.81)
RDW: 14.2 % (ref 11.5–15.5)
WBC: 8 10*3/uL (ref 4.0–10.5)

## 2018-03-06 LAB — COMPREHENSIVE METABOLIC PANEL
ALT: 36 U/L (ref 17–63)
AST: 33 U/L (ref 15–41)
Albumin: 4.1 g/dL (ref 3.5–5.0)
Alkaline Phosphatase: 64 U/L (ref 38–126)
Anion gap: 9 (ref 5–15)
BUN: 10 mg/dL (ref 6–20)
CO2: 26 mmol/L (ref 22–32)
Calcium: 9.3 mg/dL (ref 8.9–10.3)
Chloride: 103 mmol/L (ref 101–111)
Creatinine, Ser: 0.96 mg/dL (ref 0.61–1.24)
GFR calc Af Amer: >60 mL/min (ref >60)
GFR calc non Af Amer: >60 mL/min (ref >60)
Glucose, Bld: 101 mg/dL — ABNORMAL HIGH (ref 65–99)
Potassium: 4.5 mmol/L (ref 3.5–5.1)
Sodium: 138 mmol/L (ref 135–145)
Total Bilirubin: 0.8 mg/dL (ref 0.3–1.2)
Total Protein: 6.6 g/dL (ref 6.5–8.1)

## 2018-03-06 LAB — PROTIME-INR
INR: 0.94
Prothrombin Time: 12.5 seconds (ref 11.4–15.2)

## 2018-03-06 LAB — HEMOGLOBIN A1C
Hgb A1c MFr Bld: 6.1 % — ABNORMAL HIGH (ref 4.8–5.6)
Mean Plasma Glucose: 128.37 mg/dL

## 2018-03-06 LAB — APTT: aPTT: 28 seconds (ref 24–36)

## 2018-03-06 LAB — SURGICAL PCR SCREEN
MRSA, PCR: NEGATIVE
STAPHYLOCOCCUS AUREUS: NEGATIVE

## 2018-03-06 LAB — GLUCOSE, CAPILLARY: Glucose-Capillary: 115 mg/dL — ABNORMAL HIGH (ref 65–99)

## 2018-03-09 NOTE — H&P (Signed)
Kyle Perry is an 62 y.o. male.   Chief Complaint: low back pain  HPI: The patient is 62 year old male who presented with the chief complaint of low back pain. No known injury. He developed progressively worsening pain and weakness in the right LE. He did not see improvement of symptoms with conservative treatments including corticosteroids injections and activity modification. MRI and CT myelogram showed a large disc synovial cyst L4-L5 on the right with spinal stenosis.   Past Medical History:  Diagnosis Date  . Arthritis   . COPD (chronic obstructive pulmonary disease) ([place])   . Diabetes mellitus without complication ([place])   . Dyspnea   . GERD (gastroesophageal reflux disease)   . Hepatitis    Hepatitis C  . History of kidney stones   . Hypertension      Social History:  reports that he has been smoking cigarettes.  He has a 22.50 pack-year smoking history. He has never used smokeless tobacco. He reports that he drinks about 0.6 oz of alcohol per week. He reports that he does not use drugs.  Allergies:  Allergies  Allergen Reactions  . Neurontin [Gabapentin] Swelling    Makes my legs swell "big time"  . Naproxen Other (See Comments)    Tears my stomach up      Current Outpatient Medications:  .  albuterol (PROVENTIL HFA;VENTOLIN HFA) 108 (90 Base) MCG/ACT inhaler, Inhale 1-2 puffs into the lungs every 6 (six) hours as needed for wheezing or shortness of breath., Disp: , Rfl:  .  atorvastatin (LIPITOR) 10 MG tablet, Take 10 mg by mouth at bedtime., Disp: , Rfl:  .  glipiZIDE (GLUCOTROL XL) 10 MG 24 hr tablet, Take 10 mg by mouth 2 (two) times daily., Disp: , Rfl: 3 .  lisinopril (PRINIVIL,ZESTRIL) 5 MG tablet, Take 5 mg by mouth daily., Disp: , Rfl:  .  Melatonin 5 MG CAPS, Take 5 mg by mouth at bedtime. , Disp: , Rfl:  .  metFORMIN (GLUCOPHAGE-XR) 500 MG 24 hr tablet, Take 500 mg by mouth 2 (two) times daily., Disp: , Rfl: 3 .  omeprazole (PRILOSEC) 20 MG capsule, Take  20 mg by mouth every evening. , Disp: , Rfl:  .  ropinirole (REQUIP) 5 MG tablet, Take 5 mg by mouth at bedtime., Disp: , Rfl:  .  traMADol (ULTRAM) 50 MG tablet, Take 50 mg by mouth every 6 (six) hours as needed (for pain.). , Disp: , Rfl:  .  aspirin 81 MG chewable tablet, Chew 81 mg by mouth daily., Disp: , Rfl:    Review of Systems  Constitutional: Negative.   HENT: Negative.   Eyes: Negative.   Respiratory: Positive for cough and shortness of breath. Negative for hemoptysis, sputum production and wheezing.        SOB with exertion  Cardiovascular: Negative.   Gastrointestinal: Negative.   Genitourinary: Negative.   Musculoskeletal: Positive for back pain and myalgias. Negative for falls, joint pain and neck pain.  Skin: Negative.   Neurological: Negative.   Endo/Heme/Allergies: Negative.   Psychiatric/Behavioral: Negative.    Vitals HR: 84 bpm BP: 150/75 Wt: 185 lbs Ht: 69 in   Physical Exam  Constitutional: He is oriented to person, place, and time. He appears well-developed. No distress.  Overweight  HENT:  Head: Normocephalic and atraumatic.  Right Ear: External ear normal.  Left Ear: External ear normal.  Nose: Nose normal.  Mouth/Throat: Oropharynx is clear and moist.  Eyes: Conjunctivae and EOM are  normal.  Neck: Normal range of motion. Neck supple.  Cardiovascular: Normal rate, regular rhythm, normal heart sounds and intact distal pulses.  No murmur heard. Respiratory: Effort normal and breath sounds normal. No respiratory distress. He has no wheezes.  GI: Soft. Bowel sounds are normal. He exhibits no distension. There is no tenderness.  Musculoskeletal:       Right hip: Normal.       Left hip: Normal.       Right knee: Normal.       Left knee: Normal.  Pain with motion of the lumbar spine with radicular pain into the right LE.   Neurological: He is alert and oriented to person, place, and time. No sensory deficit.  Weakness of the dorsiflexors of the  right foot. Otherwise neurologically intact in the LE.   Skin: No rash noted. He is not diaphoretic. No erythema.  Psychiatric: He has a normal mood and affect. His behavior is normal.     Assessment/Plan Lumbar spinal stenosis and synovial cyst L4-L5 right He needs a lumbar decompressive laminectomy with excision of synovial cyst L4-L5 on the right. Risks and benefits of the procedure discussed with the patient by Dr. Gladstone Lighter. The patient will be kept overnight for observation.   H&P performed by Dr. Gladstone Lighter Documented by Ardeen Jourdain, PA-C  [place], Channie Bostick Ander Purpura, PA-C 03/09/2018, 5:36 PM

## 2018-03-11 MED ORDER — BUPIVACAINE LIPOSOME 1.3 % IJ SUSP
20.0000 mL | INTRAMUSCULAR | Status: DC
  Filled 2018-03-11: qty 20

## 2018-03-11 NOTE — Anesthesia Preprocedure Evaluation (Addendum)
Anesthesia Evaluation  Patient identified by MRN, date of birth, ID band Patient awake    Reviewed: Allergy & Precautions, NPO status , Patient's Chart, lab work & pertinent test results  Airway Mallampati: II  TM Distance: >3 FB Neck ROM: Full    Dental no notable dental hx.    Pulmonary shortness of breath, COPD, Current Smoker,    Pulmonary exam normal breath sounds clear to auscultation       Cardiovascular hypertension, Pt. on medications Normal cardiovascular exam Rhythm:Regular Rate:Normal     Neuro/Psych negative neurological ROS  negative psych ROS   GI/Hepatic GERD  ,(+) Hepatitis -, C  Endo/Other  diabetes, Type 2  Renal/GU negative Renal ROS     Musculoskeletal  (+) Arthritis ,   Abdominal   Peds  Hematology negative hematology ROS (+)   Anesthesia Other Findings   Reproductive/Obstetrics negative OB ROS                             Anesthesia Physical Anesthesia Plan  ASA: II  Anesthesia Plan: General   Post-op Pain Management:    Induction: Intravenous  PONV Risk Score and Plan: 2 and Ondansetron and Dexamethasone  Airway Management Planned: Oral ETT  Additional Equipment:   Intra-op Plan:   Post-operative Plan: Extubation in OR  Informed Consent: I have reviewed the patients History and Physical, chart, labs and discussed the procedure including the risks, benefits and alternatives for the proposed anesthesia with the patient or authorized representative who has indicated his/her understanding and acceptance.   Dental advisory given  Plan Discussed with: CRNA  Anesthesia Plan Comments:         Anesthesia Quick Evaluation

## 2018-03-12 ENCOUNTER — Ambulatory Visit (HOSPITAL_COMMUNITY): Payer: Medicare Other

## 2018-03-12 ENCOUNTER — Ambulatory Visit (HOSPITAL_COMMUNITY): Payer: Medicare Other | Admitting: Anesthesiology

## 2018-03-12 ENCOUNTER — Encounter (HOSPITAL_COMMUNITY)
Admission: RE | Disposition: A | Discharge status: Home / Self Care | Payer: Self-pay | Source: Ambulatory Visit | Attending: Orthopedic Surgery

## 2018-03-12 ENCOUNTER — Encounter (HOSPITAL_COMMUNITY): Payer: Self-pay

## 2018-03-12 ENCOUNTER — Other Ambulatory Visit: Payer: Self-pay

## 2018-03-12 ENCOUNTER — Observation Stay (HOSPITAL_COMMUNITY)
Admission: RE | Admit: 2018-03-12 | Discharge: 2018-03-13 | Disposition: A | Discharge status: Home / Self Care | Payer: Medicare Other | Source: Ambulatory Visit | Attending: Orthopedic Surgery | Admitting: Orthopedic Surgery

## 2018-03-12 DIAGNOSIS — M21371 Foot drop, right foot: Secondary | ICD-10-CM | POA: Insufficient documentation

## 2018-03-12 DIAGNOSIS — Z981 Arthrodesis status: Secondary | ICD-10-CM | POA: Diagnosis not present

## 2018-03-12 DIAGNOSIS — Z7982 Long term (current) use of aspirin: Secondary | ICD-10-CM | POA: Insufficient documentation

## 2018-03-12 DIAGNOSIS — I10 Essential (primary) hypertension: Secondary | ICD-10-CM | POA: Insufficient documentation

## 2018-03-12 DIAGNOSIS — M48062 Spinal stenosis, lumbar region with neurogenic claudication: Secondary | ICD-10-CM | POA: Diagnosis not present

## 2018-03-12 DIAGNOSIS — F17210 Nicotine dependence, cigarettes, uncomplicated: Secondary | ICD-10-CM | POA: Insufficient documentation

## 2018-03-12 DIAGNOSIS — K219 Gastro-esophageal reflux disease without esophagitis: Secondary | ICD-10-CM | POA: Insufficient documentation

## 2018-03-12 DIAGNOSIS — E119 Type 2 diabetes mellitus without complications: Secondary | ICD-10-CM | POA: Insufficient documentation

## 2018-03-12 DIAGNOSIS — M7138 Other bursal cyst, other site: Principal | ICD-10-CM | POA: Insufficient documentation

## 2018-03-12 DIAGNOSIS — Z79899 Other long term (current) drug therapy: Secondary | ICD-10-CM | POA: Insufficient documentation

## 2018-03-12 DIAGNOSIS — Z419 Encounter for procedure for purposes other than remedying health state, unspecified: Secondary | ICD-10-CM

## 2018-03-12 DIAGNOSIS — Z7984 Long term (current) use of oral hypoglycemic drugs: Secondary | ICD-10-CM | POA: Diagnosis not present

## 2018-03-12 DIAGNOSIS — M48061 Spinal stenosis, lumbar region without neurogenic claudication: Secondary | ICD-10-CM | POA: Diagnosis not present

## 2018-03-12 DIAGNOSIS — J449 Chronic obstructive pulmonary disease, unspecified: Secondary | ICD-10-CM | POA: Insufficient documentation

## 2018-03-12 DIAGNOSIS — M5416 Radiculopathy, lumbar region: Secondary | ICD-10-CM | POA: Diagnosis not present

## 2018-03-12 HISTORY — PX: DECOMPRESSIVE LUMBAR LAMINECTOMY LEVEL 1: SHX5791

## 2018-03-12 LAB — GLUCOSE, CAPILLARY
GLUCOSE-CAPILLARY: 118 mg/dL — AB (ref 65–99)
GLUCOSE-CAPILLARY: 211 mg/dL — AB (ref 65–99)
GLUCOSE-CAPILLARY: 331 mg/dL — AB (ref 65–99)
Glucose-Capillary: 135 mg/dL — ABNORMAL HIGH (ref 65–99)
Glucose-Capillary: 174 mg/dL — ABNORMAL HIGH (ref 65–99)

## 2018-03-12 SURGERY — DECOMPRESSIVE LUMBAR LAMINECTOMY LEVEL 1
Anesthesia: General

## 2018-03-12 MED ORDER — BACITRACIN ZINC 500 UNIT/GM EX OINT
TOPICAL_OINTMENT | CUTANEOUS | Status: AC
  Filled 2018-03-12: qty 28.35

## 2018-03-12 MED ORDER — ONDANSETRON HCL 4 MG/2ML IJ SOLN
INTRAMUSCULAR | Status: AC
  Filled 2018-03-12: qty 2

## 2018-03-12 MED ORDER — FENTANYL CITRATE (PF) 250 MCG/5ML IJ SOLN
INTRAMUSCULAR | Status: AC
  Filled 2018-03-12: qty 5

## 2018-03-12 MED ORDER — BACITRACIN-NEOMYCIN-POLYMYXIN 400-5-5000 EX OINT
TOPICAL_OINTMENT | CUTANEOUS | Status: DC | PRN
  Administered 2018-03-12: 1 via TOPICAL

## 2018-03-12 MED ORDER — THROMBIN (RECOMBINANT) 5000 UNITS EX SOLR
CUTANEOUS | Status: DC | PRN
  Administered 2018-03-12: 5000 [IU] via TOPICAL

## 2018-03-12 MED ORDER — HYDROCODONE-ACETAMINOPHEN 10-325 MG PO TABS
2.0000 | ORAL_TABLET | ORAL | Status: DC | PRN
  Administered 2018-03-12: 1 via ORAL
  Administered 2018-03-12 – 2018-03-13 (×4): 2 via ORAL
  Filled 2018-03-12 (×5): qty 2

## 2018-03-12 MED ORDER — PROPOFOL 10 MG/ML IV BOLUS
INTRAVENOUS | Status: AC
  Filled 2018-03-12: qty 20

## 2018-03-12 MED ORDER — CEFAZOLIN SODIUM-DEXTROSE 1-4 GM/50ML-% IV SOLN
1.0000 g | Freq: Three times a day (TID) | INTRAVENOUS | Status: AC
  Administered 2018-03-12 – 2018-03-13 (×3): 1 g via INTRAVENOUS
  Filled 2018-03-12 (×3): qty 50

## 2018-03-12 MED ORDER — BUPIVACAINE-EPINEPHRINE 0.5% -1:200000 IJ SOLN
INTRAMUSCULAR | Status: DC | PRN
  Administered 2018-03-12: 20 mL

## 2018-03-12 MED ORDER — PHENOL 1.4 % MT LIQD
1.0000 | OROMUCOSAL | Status: DC | PRN
  Filled 2018-03-12: qty 177

## 2018-03-12 MED ORDER — THROMBIN (RECOMBINANT) 5000 UNITS EX SOLR
CUTANEOUS | Status: AC
  Filled 2018-03-12: qty 5000

## 2018-03-12 MED ORDER — ALBUTEROL SULFATE (2.5 MG/3ML) 0.083% IN NEBU: 3.0000 mL | INHALATION_SOLUTION | Freq: Four times a day (QID) | RESPIRATORY_TRACT | Status: DC | PRN

## 2018-03-12 MED ORDER — BISACODYL 5 MG PO TBEC: 5.0000 mg | DELAYED_RELEASE_TABLET | Freq: Every day | ORAL | Status: DC | PRN

## 2018-03-12 MED ORDER — CHLORHEXIDINE GLUCONATE 4 % EX LIQD: 60.0000 mL | Freq: Once | CUTANEOUS | Status: DC

## 2018-03-12 MED ORDER — POLYETHYLENE GLYCOL 3350 17 G PO PACK: 17.0000 g | PACK | Freq: Every day | ORAL | Status: DC | PRN

## 2018-03-12 MED ORDER — PROMETHAZINE HCL 25 MG/ML IJ SOLN: 6.2500 mg | INTRAMUSCULAR | Status: DC | PRN

## 2018-03-12 MED ORDER — MIDAZOLAM HCL 2 MG/2ML IJ SOLN
INTRAMUSCULAR | Status: AC
  Filled 2018-03-12: qty 2

## 2018-03-12 MED ORDER — LACTATED RINGERS IV SOLN
INTRAVENOUS | Status: DC
  Administered 2018-03-12 – 2018-03-13 (×3): via INTRAVENOUS

## 2018-03-12 MED ORDER — ONDANSETRON HCL 4 MG PO TABS: 4.0000 mg | ORAL_TABLET | Freq: Four times a day (QID) | ORAL | Status: DC | PRN

## 2018-03-12 MED ORDER — POLYMYXIN B SULFATE 500000 UNITS IJ SOLR
INTRAMUSCULAR | Status: DC | PRN
  Administered 2018-03-12: 500 mL

## 2018-03-12 MED ORDER — DEXAMETHASONE SODIUM PHOSPHATE 10 MG/ML IJ SOLN
INTRAMUSCULAR | Status: DC | PRN
  Administered 2018-03-12: 10 mg via INTRAVENOUS

## 2018-03-12 MED ORDER — OXYCODONE HCL 5 MG/5ML PO SOLN
5.0000 mg | Freq: Once | ORAL | Status: DC | PRN
  Filled 2018-03-12: qty 5

## 2018-03-12 MED ORDER — LACTATED RINGERS IV SOLN
INTRAVENOUS | Status: DC
  Administered 2018-03-12: 10:00:00 via INTRAVENOUS
  Administered 2018-03-12: 1000 mL via INTRAVENOUS

## 2018-03-12 MED ORDER — FLEET ENEMA 7-19 GM/118ML RE ENEM: 1.0000 | ENEMA | Freq: Once | RECTAL | Status: DC | PRN

## 2018-03-12 MED ORDER — ACETAMINOPHEN 650 MG RE SUPP: 650.0000 mg | RECTAL | Status: DC | PRN

## 2018-03-12 MED ORDER — PROPOFOL 10 MG/ML IV BOLUS
INTRAVENOUS | Status: DC | PRN
  Administered 2018-03-12: 120 mg via INTRAVENOUS

## 2018-03-12 MED ORDER — METHOCARBAMOL 500 MG PO TABS: 500.0000 mg | ORAL_TABLET | Freq: Four times a day (QID) | ORAL | 0 refills | Status: AC | PRN

## 2018-03-12 MED ORDER — HYDROCODONE-ACETAMINOPHEN 5-325 MG PO TABS: 1.0000 | ORAL_TABLET | ORAL | 0 refills | Status: AC | PRN

## 2018-03-12 MED ORDER — METHOCARBAMOL 1000 MG/10ML IJ SOLN
500.0000 mg | Freq: Four times a day (QID) | INTRAVENOUS | Status: DC | PRN
  Administered 2018-03-12: 500 mg via INTRAVENOUS
  Filled 2018-03-12: qty 550

## 2018-03-12 MED ORDER — MENTHOL 3 MG MT LOZG: 1.0000 | LOZENGE | OROMUCOSAL | Status: DC | PRN

## 2018-03-12 MED ORDER — SODIUM CHLORIDE 0.9 % IV SOLN
INTRAVENOUS | Status: AC
  Filled 2018-03-12: qty 500000

## 2018-03-12 MED ORDER — HYDROMORPHONE HCL 1 MG/ML IJ SOLN
INTRAMUSCULAR | Status: AC
  Administered 2018-03-12: 0.5 mg via INTRAVENOUS
  Filled 2018-03-12: qty 2

## 2018-03-12 MED ORDER — FENTANYL CITRATE (PF) 100 MCG/2ML IJ SOLN
INTRAMUSCULAR | Status: DC | PRN
  Administered 2018-03-12: 100 ug via INTRAVENOUS
  Administered 2018-03-12 (×2): 50 ug via INTRAVENOUS

## 2018-03-12 MED ORDER — BUPIVACAINE LIPOSOME 1.3 % IJ SUSP
INTRAMUSCULAR | Status: DC | PRN
  Administered 2018-03-12: 20 mL

## 2018-03-12 MED ORDER — HYDROMORPHONE HCL 1 MG/ML IJ SOLN
0.2500 mg | INTRAMUSCULAR | Status: DC | PRN
  Administered 2018-03-12 (×4): 0.5 mg via INTRAVENOUS

## 2018-03-12 MED ORDER — SUGAMMADEX SODIUM 200 MG/2ML IV SOLN
INTRAVENOUS | Status: AC
  Filled 2018-03-12: qty 2

## 2018-03-12 MED ORDER — LISINOPRIL 5 MG PO TABS: 5.0000 mg | ORAL_TABLET | Freq: Every day | ORAL | Status: DC

## 2018-03-12 MED ORDER — PANTOPRAZOLE SODIUM 40 MG PO TBEC
40.0000 mg | DELAYED_RELEASE_TABLET | Freq: Every day | ORAL | Status: DC
  Administered 2018-03-12: 40 mg via ORAL
  Filled 2018-03-12: qty 1

## 2018-03-12 MED ORDER — MIDAZOLAM HCL 5 MG/5ML IJ SOLN
INTRAMUSCULAR | Status: DC | PRN
  Administered 2018-03-12: 2 mg via INTRAVENOUS

## 2018-03-12 MED ORDER — MELATONIN 5 MG PO TABS
5.0000 mg | ORAL_TABLET | Freq: Every day | ORAL | Status: DC
  Administered 2018-03-12: 5 mg via ORAL
  Filled 2018-03-12: qty 1

## 2018-03-12 MED ORDER — ACETAMINOPHEN 325 MG PO TABS: 650.0000 mg | ORAL_TABLET | ORAL | Status: DC | PRN

## 2018-03-12 MED ORDER — ONDANSETRON HCL 4 MG/2ML IJ SOLN
INTRAMUSCULAR | Status: DC | PRN
  Administered 2018-03-12: 4 mg via INTRAVENOUS

## 2018-03-12 MED ORDER — INSULIN ASPART 100 UNIT/ML ~~LOC~~ SOLN
11.0000 [IU] | Freq: Once | SUBCUTANEOUS | Status: AC
  Administered 2018-03-12: 11 [IU] via SUBCUTANEOUS

## 2018-03-12 MED ORDER — OXYCODONE HCL 5 MG PO TABS: 5.0000 mg | ORAL_TABLET | Freq: Once | ORAL | Status: DC | PRN

## 2018-03-12 MED ORDER — ROCURONIUM BROMIDE 100 MG/10ML IV SOLN
INTRAVENOUS | Status: DC | PRN
  Administered 2018-03-12 (×3): 10 mg via INTRAVENOUS
  Administered 2018-03-12: 50 mg via INTRAVENOUS

## 2018-03-12 MED ORDER — MEPERIDINE HCL 50 MG/ML IJ SOLN: 6.2500 mg | INTRAMUSCULAR | Status: DC | PRN

## 2018-03-12 MED ORDER — BUPIVACAINE-EPINEPHRINE (PF) 0.5% -1:200000 IJ SOLN
INTRAMUSCULAR | Status: AC
  Filled 2018-03-12: qty 30

## 2018-03-12 MED ORDER — INSULIN ASPART 100 UNIT/ML ~~LOC~~ SOLN
0.0000 [IU] | Freq: Three times a day (TID) | SUBCUTANEOUS | Status: DC
  Administered 2018-03-12: 3 [IU] via SUBCUTANEOUS
  Administered 2018-03-12: 5 [IU] via SUBCUTANEOUS
  Administered 2018-03-13: 2 [IU] via SUBCUTANEOUS

## 2018-03-12 MED ORDER — ONDANSETRON HCL 4 MG/2ML IJ SOLN: 4.0000 mg | Freq: Four times a day (QID) | INTRAMUSCULAR | Status: DC | PRN

## 2018-03-12 MED ORDER — METHOCARBAMOL 500 MG PO TABS
500.0000 mg | ORAL_TABLET | Freq: Four times a day (QID) | ORAL | Status: DC | PRN
  Administered 2018-03-12: 500 mg via ORAL
  Filled 2018-03-12: qty 1

## 2018-03-12 MED ORDER — ROPINIROLE HCL 1 MG PO TABS
5.0000 mg | ORAL_TABLET | Freq: Every day | ORAL | Status: DC
  Administered 2018-03-12: 5 mg via ORAL
  Filled 2018-03-12: qty 5

## 2018-03-12 MED ORDER — HYDROCODONE-ACETAMINOPHEN 5-325 MG PO TABS: 1.0000 | ORAL_TABLET | ORAL | Status: DC | PRN

## 2018-03-12 MED ORDER — CEFAZOLIN SODIUM-DEXTROSE 2-4 GM/100ML-% IV SOLN
2.0000 g | INTRAVENOUS | Status: AC
  Administered 2018-03-12: 2 g via INTRAVENOUS
  Filled 2018-03-12: qty 100

## 2018-03-12 MED ORDER — SUGAMMADEX SODIUM 200 MG/2ML IV SOLN
INTRAVENOUS | Status: DC | PRN
  Administered 2018-03-12: 200 mg via INTRAVENOUS

## 2018-03-12 SURGICAL SUPPLY — 40 items
BAG SPEC THK2 15X12 ZIP CLS (MISCELLANEOUS) ×1
BAG ZIPLOCK 12X15 (MISCELLANEOUS) ×3 IMPLANT
CATH FOLEY 2W COUNCIL 5CC 16FR (CATHETERS) ×2 IMPLANT
CLEANER TIP ELECTROSURG 2X2 (MISCELLANEOUS) ×3 IMPLANT
COVER SURGICAL LIGHT HANDLE (MISCELLANEOUS) ×3 IMPLANT
DRAPE MICROSCOPE LEICA (MISCELLANEOUS) ×3 IMPLANT
DRAPE POUCH INSTRU U-SHP 10X18 (DRAPES) ×3 IMPLANT
DRAPE SURG 17X11 SM STRL (DRAPES) ×3 IMPLANT
DRSG ADAPTIC 3X8 NADH LF (GAUZE/BANDAGES/DRESSINGS) ×3 IMPLANT
DRSG PAD ABDOMINAL 8X10 ST (GAUZE/BANDAGES/DRESSINGS) ×9 IMPLANT
DURAPREP 26ML APPLICATOR (WOUND CARE) ×3 IMPLANT
ELECT BLADE TIP CTD 4 INCH (ELECTRODE) ×3 IMPLANT
ELECT REM PT RETURN 15FT ADLT (MISCELLANEOUS) ×3 IMPLANT
GAUZE SPONGE 4X4 12PLY STRL (GAUZE/BANDAGES/DRESSINGS) ×2 IMPLANT
GLOVE BIOGEL PI IND STRL 8.5 (GLOVE) ×1 IMPLANT
GLOVE BIOGEL PI INDICATOR 8.5 (GLOVE) ×2
GLOVE ECLIPSE 8.0 STRL XLNG CF (GLOVE) ×3 IMPLANT
GOWN STRL REUS W/TWL XL LVL3 (GOWN DISPOSABLE) ×4 IMPLANT
KIT BASIN OR (CUSTOM PROCEDURE TRAY) ×3 IMPLANT
KIT POSITIONING SURG ANDREWS (MISCELLANEOUS) ×3 IMPLANT
MANIFOLD NEPTUNE II (INSTRUMENTS) ×3 IMPLANT
NDL SPNL 18GX3.5 QUINCKE PK (NEEDLE) ×2 IMPLANT
NEEDLE SPNL 18GX3.5 QUINCKE PK (NEEDLE) ×6 IMPLANT
PACK LAMINECTOMY ORTHO (CUSTOM PROCEDURE TRAY) ×3 IMPLANT
PAD ABD 8X10 STRL (GAUZE/BANDAGES/DRESSINGS) ×2 IMPLANT
PATTIES SURGICAL .5 X.5 (GAUZE/BANDAGES/DRESSINGS) IMPLANT
PATTIES SURGICAL .75X.75 (GAUZE/BANDAGES/DRESSINGS) ×3 IMPLANT
PATTIES SURGICAL 1X1 (DISPOSABLE) ×3 IMPLANT
SPONGE LAP 4X18 RFD (DISPOSABLE) ×3 IMPLANT
SPONGE SURGIFOAM ABS GEL 100 (HEMOSTASIS) ×3 IMPLANT
STAPLER VISISTAT 35W (STAPLE) ×3 IMPLANT
SUT VIC AB 0 CT1 27 (SUTURE)
SUT VIC AB 0 CT1 27XBRD ANTBC (SUTURE) IMPLANT
SUT VIC AB 1 CT1 27 (SUTURE) ×6
SUT VIC AB 1 CT1 27XBRD ANTBC (SUTURE) ×1 IMPLANT
SUT VIC AB 2-0 CT1 27 (SUTURE) ×6
SUT VIC AB 2-0 CT1 TAPERPNT 27 (SUTURE) ×1 IMPLANT
SYR 20CC LL (SYRINGE) ×3 IMPLANT
TOWEL OR 17X26 10 PK STRL BLUE (TOWEL DISPOSABLE) ×4 IMPLANT
TRAY FOLEY MTR SLVR 16FR STAT (SET/KITS/TRAYS/PACK) IMPLANT

## 2018-03-12 NOTE — Discharge Instructions (Signed)

## 2018-03-12 NOTE — Interval H&P Note (Signed)
History and Physical Interval Note:  03/12/2018 8:23 AM  Kyle Perry  has presented today for surgery, with the diagnosis of L4-L5 synovial cyst  The various methods of treatment have been discussed with the patient and family. After consideration of risks, benefits and other options for treatment, the patient has consented to  Procedure(s): Decompression L4-L5 and excision of synovial cyst right (N/A) as a surgical intervention .  The patient's history has been reviewed, patient examined, no change in status, stable for surgery.  I have reviewed the patient's chart and labs.  Questions were answered to the patient's satisfaction.     [name]

## 2018-03-12 NOTE — Op Note (Signed)
NAME: Kyle Perry, Kyle Perry MEDICAL RECORD RU:[id number] ACCOUNT [id number] DATE OF BIRTH:1956/01/08 FACILITY: [facility] LOCATION: [facility] PHYSICIAN:Sahasra Belue A. Harlin Mazzoni, MD  OPERATIVE REPORT  DATE OF PROCEDURE:  03/12/2018  PREOPERATIVE DIAGNOSES:   1.  Partial foot drop on the right. 2.  Large synovial cyst at L4-L5 on the right. 3.  Severe lateral recess stenosis at L4-L5 on the right. 4.  Foraminal stenosis involving the L4 root on the right. 5.  Foraminal stenosis involving the L5 root on the right    POSTOPERATIVE DIAGNOSES: 1.  Partial foot drop on the right. 2.  Large synovial cyst at L4-L5 on the right. 3.  Severe lateral recess stenosis at L4-L5 on the right. 4.  Foraminal stenosis involving the L4 root on the right. 5.  Foraminal stenosis involving the L5 root on the right    OPERATION: 1.  Decompressive lumbar laminectomy, complete at L4-5 for stenosis. 2.  Foraminotomy for the L4 root on the right. 3.  Foraminotomy for the L5 root on the right. 4.  Excision of a synovial cyst at L4-L5 on the right. 5.  Complete facetectomy at L4-L5 on the right.  SURGEON:  [name], MD  ASSISTANT:  Ardeen Jourdain, PA.  DESCRIPTION OF PROCEDURE:  Under general anesthesia, routine orthopedic prep and draping of the lower back was carried out.  The appropriate timeout was carried out.  He had 2 grams of IV Ancef.  I also marked the appropriate right side of his back in a  holding area even though we went central.  At this point, 2 needles were placed in the back for localization purposes.  An x-ray was taken.  Following that, an incision was made over the L4-L5 interspace.  The incision was extended proximally and  distally.  I then inserted self-retaining retractors.  I then stripped the muscle from the lamina and spinous process bilaterally at L4-L5 proximally and distally.  A Kocher cap was placed on the spinous process of L4.  Another x-ray was taken.   Following that, the [facility]  retractors then were inserted.  We had good hemostasis.  I then went down and removed the spinous process of L4 and I went proximally and distally.  Went all the way down to the dura and did a central decompressive lumbar  laminectomy.  Upon getting down there, I was quite surprised that the stenosis was much more severe than ever showed up on his myelogram.  He had marked overgrowth and enlargement of the facet.  In order to get down into the ligamentum flavum, we had to  remove that lower facet on L4-L5 on the right.  Once this was done, we noted a large synovial cyst with severe thickening of the ligamentum flavum, mainly on the right.  We gradually dissected that off the dura.  Cottonoids were placed between the dura  and the cyst.  We then removed the synovial cyst.  We had a nice foraminotomy for the L4 root and also for the L5 root on the right.  I was able to after, completely dissect this free in order to remove that facet.  The facet was markedly overgrown, in  fact, it was overgrown across the midline.  I had to start on the opposite left side and come across to the right in order to get into the canal.  Once again, I was quite surprised of the findings we saw at the time of surgery versus the myelogram.   Following that, I then went out  into the foramen for both roots with the hockey stick.  The roots now were free.  We went down and traced the distal root as well;  it was free.  We thoroughly irrigated out the area, loosely applied some thrombin-soaked  Gelfoam.  We closed the wound in layers in the usual fashion.  Sterile dressings were applied.  We at the beginning of the procedure, injected 20 mL 0.5% Marcaine with epinephrine to control soft tissue bleeding.  At the end of the procedure, I injected  20 mL of Exparel for pain control.  Sterile dressings were applied.  As I mentioned, he had 2 grams of IV Ancef.    He will be kept overnight for pain control.  AN/NUANCE  D:03/12/2018  T:03/12/2018 JOB:[id number]

## 2018-03-12 NOTE — Anesthesia Procedure Notes (Signed)
Procedure Name: Intubation Date/Time: 03/12/2018 8:36 AM Performed by: Glory Buff, CRNA Pre-anesthesia Checklist: Patient identified, Emergency Drugs available, Suction available and Patient being monitored Patient Re-evaluated:Patient Re-evaluated prior to induction Oxygen Delivery Method: Circle system utilized Preoxygenation: Pre-oxygenation with 100% oxygen Induction Type: IV induction Ventilation: Mask ventilation without difficulty Laryngoscope Size: Miller and 3 Grade View: Grade I Tube type: Oral Tube size: 7.5 mm Number of attempts: 1 Airway Equipment and Method: Stylet and Oral airway Placement Confirmation: ETT inserted through vocal cords under direct vision,  positive ETCO2 and breath sounds checked- equal and bilateral Secured at: 21 cm Tube secured with: Tape Dental Injury: Teeth and Oropharynx as per pre-operative assessment

## 2018-03-12 NOTE — Brief Op Note (Signed)
03/12/2018  10:24 AM  PATIENT:  Kyle Perry  62 y.o. male  PRE-OPERATIVE DIAGNOSIS:  L4-L5 synovial cyst.Severe Spinal Stenosis and Foraminal Stenosis involving the L-4 and the L-5 nerve roots on the Right.Foot Drop on the right.  POST-OPERATIVE DIAGNOSIS:  Same as Pre-op  PROCEDURE:  Procedure(s): Decompression L4-L5 and excision of synovial cyst right (N/A).Foraminotomies for the L-4 and L-5 Nerve Roots. Facetectomy at L-4-L-5 on the right.  SURGEON:  Surgeon(s) and Role:    [name], MD - Primary  PHYSICIAN ASSISTANT:Amber Rangerville PA   ASSISTANTS:Amber Blackshear PA   ANESTHESIA:   general  EBL:  100 mL   BLOOD ADMINISTERED:none  DRAINS: none   LOCAL MEDICATIONS USED:  MARCAINE 20cc of 0.50% with Epinephrine at the start and 20cc of Exparel at the end.    SPECIMEN:  No Specimen  DISPOSITION OF SPECIMEN:  N/A  COUNTS:  YES  TOURNIQUET:  * No tourniquets in log *  DICTATION: .Other Dictation: Dictation Number [id number]  PLAN OF CARE: Admit for overnight observation  PATIENT DISPOSITION:  Stable in the OR.   Delay start of Pharmacological VTE agent (>24hrs) due to surgical blood loss or risk of bleeding: yes

## 2018-03-12 NOTE — Transfer of Care (Signed)
Immediate Anesthesia Transfer of Care Note  Patient: Kyle Perry  Procedure(s) Performed: Decompression L4-L5 and excision of synovial cyst right (N/A )  Patient Location: PACU  Anesthesia Type:General  Level of Consciousness: awake, alert  and oriented  Airway & Oxygen Therapy: Patient Spontanous Breathing and Patient connected to face mask oxygen  Post-op Assessment: Report given to RN and Post -op Vital signs reviewed and stable  Post vital signs: Reviewed and stable  Last Vitals:  Vitals Value Taken Time  BP 165/90 03/12/2018 10:39 AM  Temp    Pulse 92 03/12/2018 10:41 AM  Resp 15 03/12/2018 10:41 AM  SpO2 100 % 03/12/2018 10:41 AM  Vitals shown include unvalidated device data.  Last Pain:  Vitals:   03/12/18 0648  TempSrc:   PainSc: 3       Patients Stated Pain Goal: 3 (29/19/16 6060)  Complications: No apparent anesthesia complications

## 2018-03-12 NOTE — Evaluation (Signed)
Physical Therapy Evaluation Patient Details Name: Kyle Perry MRN: [id number] DOB: [birth year] Today's Date: 03/12/2018   History of Present Illness  Pt s/p L4-5 Decompressive Laminectomy  Clinical Impression  Pt s/p back surgery and presents with functional mobility limitations 2* post op pain and back precautions.  Pt should progress to dc home with assist of family.    Follow Up Recommendations Follow surgeon's recommendation for DC plan and follow-up therapies    Equipment Recommendations  None recommended by PT    Recommendations for Other Services OT consult     Precautions / Restrictions Precautions Precautions: Fall;Back Precaution Booklet Issued: Yes (comment) Restrictions Weight Bearing Restrictions: No      Mobility  Bed Mobility Overal bed mobility: Needs Assistance Bed Mobility: Supine to Sit     Supine to sit: Min assist     General bed mobility comments: cues for correct log roll technique and adherence to back precautions  Transfers Overall transfer level: Needs assistance Equipment used: Rolling walker (2 wheeled) Transfers: Sit to/from Stand Sit to Stand: Min guard         General transfer comment: cues for transition position, use of UEs to self assist and adherence to back precautions  Ambulation/Gait Ambulation/Gait assistance: Min guard Ambulation Distance (Feet): 400 Feet Assistive device: Rolling walker (2 wheeled) Gait Pattern/deviations: Step-through pattern;Decreased step length - right;Decreased step length - left;Shuffle Gait velocity: decr   General Gait Details: cues for posture, pace and position from [employer]            Wheelchair Mobility    Modified Rankin (Stroke Patients Only)       Balance                                             Pertinent Vitals/Pain Pain Assessment: 0-10 Pain Score: 6  Pain Location: back and R sciatic nerve Pain Descriptors / Indicators:  Aching;Sore Pain Intervention(s): Limited activity within patient's tolerance;Monitored during session;Premedicated before session    [place] expects to be discharged to:: Private residence Living Arrangements: Spouse/significant other Available Help at Discharge: Family Type of Home: House Home Access: Stairs to enter Entrance Stairs-Rails: None Technical brewer of Steps: 2 Home Layout: One level Home Equipment: Environmental consultant - 2 wheels      Prior Function Level of Independence: Independent               Hand Dominance        Extremity/Trunk Assessment   Upper Extremity Assessment Upper Extremity Assessment: Overall WFL for tasks assessed    Lower Extremity Assessment Lower Extremity Assessment: Overall WFL for tasks assessed       Communication   Communication: No difficulties  Cognition Arousal/Alertness: Awake/alert Behavior During Therapy: WFL for tasks assessed/performed Overall Cognitive Status: Within Functional Limits for tasks assessed                                        General Comments      Exercises     Assessment/Plan    PT Assessment Patient needs continued PT services  PT Problem List Decreased strength;Decreased range of motion;Decreased activity tolerance;Decreased mobility;Decreased knowledge of use of DME;Pain;Decreased knowledge of precautions       PT Treatment Interventions DME instruction;Gait training;Stair training;Functional  mobility training;Therapeutic activities;Therapeutic exercise;Patient/family education    PT Goals (Current goals can be found in the Care Plan section)  Acute Rehab PT Goals Patient Stated Goal: move without pain PT Goal Formulation: With patient Time For Goal Achievement: 03/19/18 Potential to Achieve Goals: Good    Frequency 7X/week   Barriers to discharge        Co-evaluation               AM-PAC PT "6 Clicks" Daily Activity  Outcome Measure  Difficulty turning over in bed (including adjusting bedclothes, sheets and blankets)?: A Lot Difficulty moving from lying on back to sitting on the side of the bed? : A Lot Difficulty sitting down on and standing up from a chair with arms (e.g., wheelchair, bedside commode, etc,.)?: A Lot Help needed moving to and from a bed to chair (including a wheelchair)?: A Little Help needed walking in hospital room?: A Little Help needed climbing 3-5 steps with a railing? : A Little 6 Click Score: 15    End of Session   Activity Tolerance: Patient tolerated treatment well Patient left: in chair;with call bell/phone within reach Nurse Communication: Mobility status PT Visit Diagnosis: Difficulty in walking, not elsewhere classified (R26.2)    Time: 5056-9794 PT Time Calculation (min) (ACUTE ONLY): 22 min   Charges:   PT Evaluation $PT Eval Low Complexity: 1 Low     PT G Codes:        Pg [phone]   Kiley Solimine 03/12/2018, 5:44 PM

## 2018-03-12 NOTE — Anesthesia Postprocedure Evaluation (Signed)
Anesthesia Post Note  Patient: Kyle Perry  Procedure(s) Performed: Decompression L4-L5 and excision of synovial cyst right (N/A )     Patient location during evaluation: PACU Anesthesia Type: General Level of consciousness: sedated and patient cooperative Pain management: pain level controlled Vital Signs Assessment: post-procedure vital signs reviewed and stable Respiratory status: spontaneous breathing Cardiovascular status: stable Anesthetic complications: no    Last Vitals:  Vitals:   03/12/18 1446 03/12/18 1817  BP: 127/77 (!) 151/87  Pulse: (!) 101 (!) 106  Resp: 13 (!) 24  Temp: 36.8 C   SpO2: 98% 98%    Last Pain:  Vitals:   03/12/18 1807  TempSrc:   PainSc: [place]

## 2018-03-13 DIAGNOSIS — M7138 Other bursal cyst, other site: Secondary | ICD-10-CM | POA: Diagnosis not present

## 2018-03-13 LAB — GLUCOSE, CAPILLARY
Glucose-Capillary: 140 mg/dL — ABNORMAL HIGH (ref 65–99)
Glucose-Capillary: 218 mg/dL — ABNORMAL HIGH (ref 65–99)

## 2018-03-13 NOTE — Progress Notes (Signed)
Physical Therapy Treatment Patient Details Name: Kyle Perry MRN: [id number] DOB: 1955-12-26 Today's Date: 03/13/2018    History of Present Illness Pt s/p L4-5 Decompressive Laminectomy    PT Comments    Ready for DC, ambulating with RW.    Follow Up Recommendations  Follow surgeon's recommendation for DC plan and follow-up therapies     Equipment Recommendations  None recommended by PT    Recommendations for Other Services       Precautions / Restrictions Precautions Precautions: Fall;Back Precaution Booklet Issued: Yes (comment)    Mobility  Bed Mobility               General bed mobility comments: pt OOB  Transfers   Equipment used: Rolling walker (2 wheeled) Transfers: Sit to/from [employer] Sit to Stand: Supervision Stand pivot transfers: Supervision       General transfer comment: VC for hand placement and back precautions  Ambulation/Gait Ambulation/Gait assistance: Supervision Ambulation Distance (Feet): 300 Feet Assistive device: Rolling walker (2 wheeled) Gait Pattern/deviations: Step-through pattern;Decreased step length - right;Decreased step length - left;Shuffle     General Gait Details: cues for posture, pace and position from RW   Stairs Stairs: Yes Stairs assistance: Min assist Stair Management: No rails Number of Stairs: 2 General stair comments: HHA   Wheelchair Mobility    Modified Rankin (Stroke Patients Only)       Balance                                            Cognition Arousal/Alertness: Awake/alert                                            Exercises      General Comments        Pertinent Vitals/Pain Pain Score: 7  Pain Location: back and R sciatic nerve Pain Descriptors / Indicators: Sore Pain Intervention(s): Monitored during session;Premedicated before session    Home Living                      Prior Function           PT Goals (current goals can now be found in the care plan section) Progress towards PT goals: Progressing toward goals    Frequency    7X/week      PT Plan Current plan remains appropriate    Co-evaluation              AM-PAC PT "6 Clicks" Daily Activity  Outcome Measure  Difficulty turning over in bed (including adjusting bedclothes, sheets and blankets)?: A Lot Difficulty moving from lying on back to sitting on the side of the bed? : A Lot Difficulty sitting down on and standing up from a chair with arms (e.g., wheelchair, bedside commode, etc,.)?: A Lot Help needed moving to and from a bed to chair (including a wheelchair)?: A Little Help needed walking in hospital room?: A Little Help needed climbing 3-5 steps with a railing? : A Little 6 Click Score: 15    End of Session   Activity Tolerance: Patient tolerated treatment well Patient left: in chair;with chair alarm set Nurse Communication: Mobility status PT Visit Diagnosis: Difficulty in walking, not elsewhere classified (R26.2)  Time: 0821-0839 PT Time Calculation (min) (ACUTE ONLY): 18 min  Charges:  $Gait Training: 8-22 mins                    G CodesTresa Endo PT [phone]    Claretha Cooper 03/13/2018, 5:37 PM

## 2018-03-13 NOTE — Evaluation (Signed)
Occupational Therapy Evaluation Patient Details Name: ORREN PIETSCH MRN: [id number] DOB: 03/29/1956 Today's Date: 03/13/2018    History of Present Illness Pt s/p L4-5 Decompressive Laminectomy   Clinical Impression   OT education complete regarding ADL activity s/p back sx.      Follow Up Recommendations  No OT follow up;Supervision - Intermittent    Equipment Recommendations  None recommended by OT    Recommendations for Other Services       Precautions / Restrictions Precautions Precautions: Fall;Back Precaution Booklet Issued: Yes (comment) Restrictions Weight Bearing Restrictions: No      Mobility Bed Mobility               General bed mobility comments: pt OOB  Transfers Overall transfer level: Needs assistance Equipment used: Rolling walker (2 wheeled) Transfers: Sit to/from [employer] Transfers Sit to Stand: Supervision Stand pivot transfers: Supervision       General transfer comment: VC for hand placement and back precautions    Balance Overall balance assessment: Mild deficits observed, not formally tested                                         ADL either performed or assessed with clinical judgement   ADL Overall ADL's : Needs assistance/impaired                                       General ADL Comments: Pt overall S- min guard A.  Pt educated on back precautions with ADL activity .  Handout provided.  Pt has AE from prior hip sx. Pt able to verbalize understanding of  back precautions in context with ADL activity     Vision Patient Visual Report: No change from baseline       Perception     Praxis      Pertinent Vitals/Pain Pain Score: 4  Pain Location: back and R sciatic nerve Pain Descriptors / Indicators: Sore Pain Intervention(s): Monitored during session           Communication Communication Communication: No difficulties   Cognition Arousal/Alertness:  Awake/alert Behavior During Therapy: WFL for tasks assessed/performed Overall Cognitive Status: Within Functional Limits for tasks assessed                                                Home Living Family/patient expects to be discharged to:: Private residence Living Arrangements: Spouse/significant other Available Help at Discharge: Family Type of Home: House Home Access: Stairs to enter Technical brewer of Steps: 2 Entrance Stairs-Rails: None Home Layout: One level               Home Equipment: Environmental consultant - 2 wheels          Prior Functioning/Environment Level of Independence: Independent                          OT Goals(Current goals can be found in the care plan section) Acute Rehab OT Goals Patient Stated Goal: move without pain  OT Frequency:      AM-PAC PT "6 Clicks" Daily Activity     Outcome Measure Help from another person  eating meals?: None Help from another person taking care of personal grooming?: None Help from another person toileting, which includes using toliet, bedpan, or urinal?: A Little Help from another person bathing (including washing, rinsing, drying)?: A Little Help from another person to put on and taking off regular upper body clothing?: None Help from another person to put on and taking off regular lower body clothing?: A Little 6 Click Score: 21   End of Session Equipment Utilized During Treatment: Rolling walker  Activity Tolerance: Patient tolerated treatment well Patient left: in chair                   Time: 8003-4917 OT Time Calculation (min): 11 min Charges:  OT General Charges $OT Visit: 1 Visit OT Evaluation $OT Eval Low Complexity: 1 Low G-Codes:     Kari Baars, [place]  Payton Mccallum D 03/13/2018, 10:16 AM

## 2018-03-13 NOTE — Discharge Summary (Addendum)
Physician Discharge Summary   Patient ID: Kyle Perry MRN: [id number] DOB/AGE: 03-30-1956 62 y.o.  Admit date: 03/12/2018 Discharge date: 03/13/2018  Primary Diagnosis:   L4-L5 synovial cyst  Admission Diagnoses:  Past Medical History:  Diagnosis Date  . Arthritis   . COPD (chronic obstructive pulmonary disease) ([place])   . Diabetes mellitus without complication ([place])   . Dyspnea   . GERD (gastroesophageal reflux disease)   . Hepatitis    Hepatitis C  . History of kidney stones   . Hypertension    Discharge Diagnoses:   Active Problems:   Spinal stenosis, lumbar region with neurogenic claudication  Procedure:  Procedure(s) (LRB): Decompression L4-L5 and excision of synovial cyst right (N/A)   Consults: None  HPI: The patient is 62 year old male who presented with the chief complaint of low back pain. No known injury. He developed progressively worsening pain and weakness in the right LE. He did not see improvement of symptoms with conservative treatments including corticosteroids injections and activity modification. MRI and CT myelogram showed a large disc synovial cyst L4-L5 on the right with spinal stenosis.        Laboratory Data: Hospital Outpatient Visit on 03/06/2018  Component Date Value Ref Range Status  . aPTT 03/06/2018 28  24 - 36 seconds Final   Performed at [facility], [place] [address] [place], [place] [address]  . WBC 03/06/2018 8.0  4.0 - 10.5 K/uL Final  . RBC 03/06/2018 5.15  4.22 - 5.81 MIL/uL Final  . Hemoglobin 03/06/2018 14.6  13.0 - 17.0 g/dL Final  . HCT 03/06/2018 44.8  39.0 - 52.0 % Final  . MCV 03/06/2018 87.0  78.0 - 100.0 fL Final  . MCH 03/06/2018 28.3  26.0 - 34.0 pg Final  . MCHC 03/06/2018 32.6  30.0 - 36.0 g/dL Final  . RDW 03/06/2018 14.2  11.5 - 15.5 % Final  . Platelets 03/06/2018 233  150 - 400 K/uL Final  . Neutrophils Relative % 03/06/2018 59  % Final  . Neutro Abs 03/06/2018 4.8  1.7 - 7.7 K/uL Final   . Lymphocytes Relative 03/06/2018 32  % Final  . Lymphs Abs 03/06/2018 2.6  0.7 - 4.0 K/uL Final  . Monocytes Relative 03/06/2018 5  % Final  . Monocytes Absolute 03/06/2018 0.4  0.1 - 1.0 K/uL Final  . Eosinophils Relative 03/06/2018 3  % Final  . Eosinophils Absolute 03/06/2018 0.3  0.0 - 0.7 K/uL Final  . Basophils Relative 03/06/2018 1  % Final  . Basophils Absolute 03/06/2018 0.0  0.0 - 0.1 K/uL Final   Performed at [facility], [place] [address] [place], [place] [address]  . Sodium 03/06/2018 138  135 - 145 mmol/L Final  . Potassium 03/06/2018 4.5  3.5 - 5.1 mmol/L Final  . Chloride 03/06/2018 103  101 - 111 mmol/L Final  . CO2 03/06/2018 26  22 - 32 mmol/L Final  . Glucose, Bld 03/06/2018 101* 65 - 99 mg/dL Final  . BUN 03/06/2018 10  6 - 20 mg/dL Final  . Creatinine, Ser 03/06/2018 0.96  0.61 - 1.24 mg/dL Final  . Calcium 03/06/2018 9.3  8.9 - 10.3 mg/dL Final  . Total Protein 03/06/2018 6.6  6.5 - 8.1 g/dL Final  . Albumin 03/06/2018 4.1  3.5 - 5.0 g/dL Final  . AST 03/06/2018 33  15 - 41 U/L Final  . ALT 03/06/2018 36  17 - 63 U/L Final  . Alkaline Phosphatase 03/06/2018  64  38 - 126 U/L Final  . Total Bilirubin 03/06/2018 0.8  0.3 - 1.2 mg/dL Final  . GFR calc non Af Amer 03/06/2018 >60  >60 mL/min Final  . GFR calc Af Amer 03/06/2018 >60  >60 mL/min Final   Comment: (NOTE) The eGFR has been calculated using the CKD EPI equation. This calculation has not been validated in all clinical situations. eGFR's persistently <60 mL/min signify possible Chronic Kidney Disease.   Georgiann Hahn gap 03/06/2018 9  5 - 15 Final   Performed at [facility], [place] [address] [place], [place] [address]  . Prothrombin Time 03/06/2018 12.5  11.4 - 15.2 seconds Final  . INR 03/06/2018 0.94   Final   Performed at [facility], [place] [address], [place], [place] [address]  . Color, Urine 03/06/2018 YELLOW  YELLOW Final  . APPearance 03/06/2018  CLEAR  CLEAR Final  . Specific Gravity, Urine 03/06/2018 1.016  1.005 - 1.030 Final  . pH 03/06/2018 7.0  5.0 - 8.0 Final  . Glucose, UA 03/06/2018 NEGATIVE  NEGATIVE mg/dL Final  . Hgb urine dipstick 03/06/2018 NEGATIVE  NEGATIVE Final  . Bilirubin Urine 03/06/2018 NEGATIVE  NEGATIVE Final  . Ketones, ur 03/06/2018 NEGATIVE  NEGATIVE mg/dL Final  . Protein, ur 03/06/2018 NEGATIVE  NEGATIVE mg/dL Final  . Nitrite 03/06/2018 NEGATIVE  NEGATIVE Final  . Leukocytes, UA 03/06/2018 NEGATIVE  NEGATIVE Final   Performed at [facility], [place] [address], [place], [place] [address]  . MRSA, PCR 03/06/2018 NEGATIVE  NEGATIVE Final  . Staphylococcus aureus 03/06/2018 NEGATIVE  NEGATIVE Final   Comment: (NOTE) The Xpert SA Assay (FDA approved for NASAL specimens in patients 50 years of age and older), is one component of a comprehensive surveillance program. It is not intended to diagnose infection nor to guide or monitor treatment. Performed at [facility], [place] [address] [place], [place] [address]   . Hgb A1c MFr Bld 03/06/2018 6.1* 4.8 - 5.6 % Final   Comment: (NOTE) Pre diabetes:          5.7%-6.4% Diabetes:              >6.4% Glycemic control for   <7.0% adults with diabetes   . Mean Plasma Glucose 03/06/2018 128.37  mg/dL Final   Performed at [place] [address] [place],  [address]  . Glucose-Capillary 03/06/2018 115* 65 - 99 mg/dL Final     EKG: Orders placed or performed during the hospital encounter of 03/06/18  . EKG 12-Lead  . EKG 12-Lead     Hospital Course: Patient was admitted to [facility] and taken to the OR and underwent the above state procedure without complications.  Patient tolerated the procedure well and was later transferred to the recovery room and then to the orthopaedic floor for postoperative care.  They were given PO and IV analgesics for pain control following their surgery.  They were  given 24 hours of postoperative antibiotics.   PT was consulted postop to assist with mobility and transfers.  The patient was allowed to be WBAT with therapy and was taught back precautions. Discharge planning was consulted to help with postop disposition and equipment needs.  Patient had a great night on the evening of surgery and started to get up OOB with therapy on day one. Patient was seen in rounds and was ready to go home on day one.  They were given discharge instructions  and dressing directions.  They were instructed on when to follow up in the office with Dr. Gladstone Lighter   Diet: Cardiac diet Activity:WBAT Follow-up:in 2 weeks Disposition - Home Discharged Condition: stable   Discharge Instructions    Call MD / Call 911   Complete by:  As directed    If you experience chest pain or shortness of breath, CALL 911 and be transported to the hospital emergency room.  If you develope a fever above 101 F, pus (white drainage) or increased drainage or redness at the wound, or calf pain, call your surgeon's office.   Constipation Prevention   Complete by:  As directed    Drink plenty of fluids.  Prune juice may be helpful.  You may use a stool softener, such as Colace (over the counter) 100 mg twice a day.  Use MiraLax (over the counter) for constipation as needed.   Diet - low sodium heart healthy   Complete by:  As directed    Diet Carb Modified   Complete by:  As directed    Discharge instructions   Complete by:  As directed    For the first three days, remove your dressing, and tape a piece of saran wrap over your incision  Take your shower, then remove the saran wrap and put a clean dressing on. After three days you can shower without the saran wrap.  No lifting or excessive bending No driving while taking pain medications Call Dr. Gladstone Lighter if any wound complications or temperature of 101 degrees F or over.  Call the office for an appointment to see Dr. Gladstone Lighter in two weeks:  [phone] and ask for Dr. Charlestine Night nurse, Brunilda Payor.   Increase activity slowly as tolerated   Complete by:  As directed      Allergies as of 03/13/2018      Reactions   Neurontin [gabapentin] Swelling   Makes my legs swell "big time"   Naproxen Other (See Comments)   Tears my stomach up      Medication List    TAKE these medications   albuterol 108 (90 Base) MCG/ACT inhaler Commonly known as:  PROVENTIL HFA;VENTOLIN HFA Inhale 1-2 puffs into the lungs every 6 (six) hours as needed for wheezing or shortness of breath.   aspirin 81 MG chewable tablet Chew 81 mg by mouth daily.   atorvastatin 10 MG tablet Commonly known as:  LIPITOR Take 10 mg by mouth at bedtime.   glipiZIDE 10 MG 24 hr tablet Commonly known as:  GLUCOTROL XL Take 10 mg by mouth 2 (two) times daily.   HYDROcodone-acetaminophen 5-325 MG tablet Commonly known as:  NORCO/VICODIN Take 1 tablet by mouth every 4 (four) hours as needed for moderate pain ((score 4 to 6)).   lisinopril 5 MG tablet Commonly known as:  PRINIVIL,ZESTRIL Take 5 mg by mouth daily.   Melatonin 5 MG Caps Take 5 mg by mouth at bedtime.   metFORMIN 500 MG 24 hr tablet Commonly known as:  GLUCOPHAGE-XR Take 500 mg by mouth 2 (two) times daily.   methocarbamol 500 MG tablet Commonly known as:  ROBAXIN Take 1 tablet (500 mg total) by mouth every 6 (six) hours as needed for muscle spasms.   omeprazole 20 MG capsule Commonly known as:  PRILOSEC Take 20 mg by mouth every evening.   ropinirole 5 MG tablet Commonly known as:  REQUIP Take 5 mg by mouth at bedtime.   traMADol 50 MG tablet Commonly known as:  Veatrice Bourbon  Take 50 mg by mouth every 6 (six) hours as needed (for pain.).      Follow-up Information    [name], MD. Schedule an appointment as soon as possible for a visit in 2 week(s).   Specialty:  Orthopedic Surgery Contact information: [address] [place] [address] [phone]             Signed: Ardeen Jourdain, PA-C Orthopaedic Surgery 03/13/2018, 1:57 PM

## 2018-03-13 NOTE — Progress Notes (Signed)
   Subjective: 1 Day Post-Op Procedure(s) (LRB): Decompression L4-L5 and excision of synovial cyst right (N/A) Patient reports pain as mild.   Patient seen in rounds with Dr. Gladstone Lighter. Patient is well, and has had no acute complaints or problems. Denies leg pain. Only mild discomfort in low back. Voiding and positive flatus. Walked with PT yesterday.    Objective: Vital signs in last 24 hours: Temp:  [97.7 F (36.5 C)-99.9 F (37.7 C)] 98.6 F (37 C) (05/16 0558) Pulse Rate:  [62-110] 86 (05/16 0558) Resp:  [11-24] 17 (05/16 0558) BP: (126-165)/(60-90) 129/72 (05/16 0558) SpO2:  [95 %-100 %] 97 % (05/16 0558)  Intake/Output from previous day:  Intake/Output Summary (Last 24 hours) at 03/13/2018 0746 Last data filed at 03/13/2018 0600 Gross per 24 hour  Intake 4171.68 ml  Output 2465 ml  Net 1706.68 ml     EXAM General - Patient is Alert and Oriented Extremity - Neurologically intact Sensation intact distally Intact pulses distally Dorsiflexion/Plantar flexion intact Compartment soft Dressing - dressing C/D/I Motor Function - intact, moving foot and toes well on exam.    Past Medical History:  Diagnosis Date  . Arthritis   . COPD (chronic obstructive pulmonary disease) ([place])   . Diabetes mellitus without complication ([place])   . Dyspnea   . GERD (gastroesophageal reflux disease)   . Hepatitis    Hepatitis C  . History of kidney stones   . Hypertension     Assessment/Plan: 1 Day Post-Op Procedure(s) (LRB): Decompression L4-L5 and excision of synovial cyst right (N/A) Active Problems:   Spinal stenosis, lumbar region with neurogenic claudication  Estimated body mass index is 27.78 kg/m as calculated from the following:   Height as of this encounter: 5\' 9"  (1.753 m).   Weight as of this encounter: 85.3 kg (188 lb 2 oz). Advance diet Up with therapy D/C IV fluids  DVT Prophylaxis - None Weight-Bearing as tolerated    Plan for DC home today. Will have RN  change dressing once up. Follow up in office in 2 weeks for wound check. Discharge instructions given.   Ardeen Jourdain, PA-C Orthopaedic Surgery 03/13/2018, 7:46 AM

## 2018-04-11 DIAGNOSIS — Z720 Tobacco use: Secondary | ICD-10-CM | POA: Diagnosis not present

## 2018-04-11 DIAGNOSIS — M545 Low back pain: Secondary | ICD-10-CM | POA: Diagnosis not present

## 2018-04-11 DIAGNOSIS — E119 Type 2 diabetes mellitus without complications: Secondary | ICD-10-CM | POA: Diagnosis not present

## 2018-04-11 DIAGNOSIS — E7800 Pure hypercholesterolemia, unspecified: Secondary | ICD-10-CM | POA: Diagnosis not present

## 2018-04-11 DIAGNOSIS — E538 Deficiency of other specified B group vitamins: Secondary | ICD-10-CM | POA: Diagnosis not present

## 2018-04-14 DIAGNOSIS — Z720 Tobacco use: Secondary | ICD-10-CM | POA: Diagnosis not present

## 2018-04-14 DIAGNOSIS — M545 Low back pain: Secondary | ICD-10-CM | POA: Diagnosis not present

## 2018-04-14 DIAGNOSIS — M5416 Radiculopathy, lumbar region: Secondary | ICD-10-CM | POA: Diagnosis not present

## 2018-04-14 DIAGNOSIS — Z0001 Encounter for general adult medical examination with abnormal findings: Secondary | ICD-10-CM | POA: Diagnosis not present

## 2018-04-14 DIAGNOSIS — E1165 Type 2 diabetes mellitus with hyperglycemia: Secondary | ICD-10-CM | POA: Diagnosis not present

## 2018-04-14 DIAGNOSIS — Z6827 Body mass index (BMI) 27.0-27.9, adult: Secondary | ICD-10-CM | POA: Diagnosis not present

## 2018-04-14 DIAGNOSIS — E7800 Pure hypercholesterolemia, unspecified: Secondary | ICD-10-CM | POA: Diagnosis not present

## 2018-04-14 DIAGNOSIS — M1990 Unspecified osteoarthritis, unspecified site: Secondary | ICD-10-CM | POA: Diagnosis not present

## 2018-05-05 DIAGNOSIS — Z6828 Body mass index (BMI) 28.0-28.9, adult: Secondary | ICD-10-CM | POA: Diagnosis not present

## 2018-05-05 DIAGNOSIS — M79661 Pain in right lower leg: Secondary | ICD-10-CM | POA: Diagnosis not present

## 2018-05-05 DIAGNOSIS — M25561 Pain in right knee: Secondary | ICD-10-CM | POA: Diagnosis not present

## 2018-05-15 DIAGNOSIS — E119 Type 2 diabetes mellitus without complications: Secondary | ICD-10-CM | POA: Diagnosis not present

## 2018-05-15 DIAGNOSIS — M1711 Unilateral primary osteoarthritis, right knee: Secondary | ICD-10-CM | POA: Diagnosis not present

## 2018-05-15 DIAGNOSIS — G619 Inflammatory polyneuropathy, unspecified: Secondary | ICD-10-CM | POA: Diagnosis not present

## 2018-05-15 DIAGNOSIS — E1165 Type 2 diabetes mellitus with hyperglycemia: Secondary | ICD-10-CM | POA: Diagnosis not present

## 2018-05-15 DIAGNOSIS — Z6828 Body mass index (BMI) 28.0-28.9, adult: Secondary | ICD-10-CM | POA: Diagnosis not present

## 2018-05-18 IMAGING — CT CT L SPINE W/ CM
1 of 7 series · 5 of 14 positions shown, 7 images · non-contrast
Comparison: MRI of the lumbar spine 09/17/2017 at [REDACTED].

CLINICAL DATA: Right lower extremity radiculopathy. Known synovial
cyst on the right at L4-5.
TECHNIQUE: Contiguous axial images were obtained through the Lumbar spine after
the intrathecal infusion of infusion. Coronal and sagittal
reconstructions were obtained of the axial image sets.

[Series 3: l spine soft · axial · 0.33mm/px · z∈[-83,+67]mm · 5 of 76 slices shown, 7 images]
[im 13/76  soft-tissue]
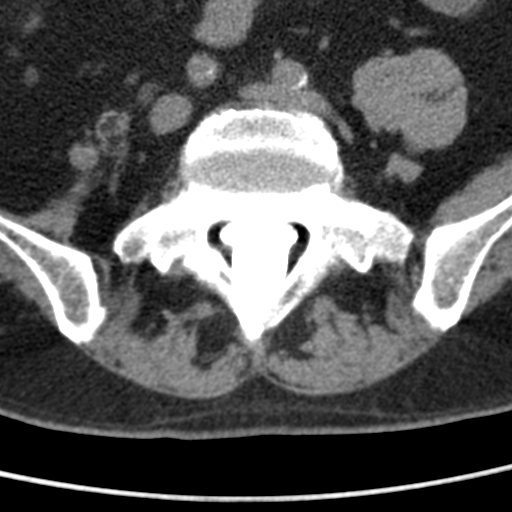
[im 13/76  bone]
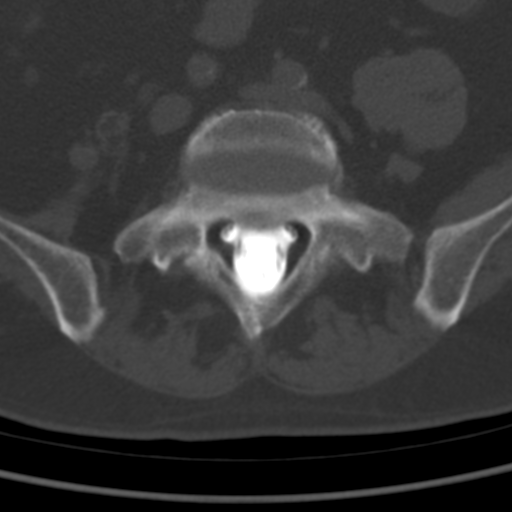
[im 26/76  bone]
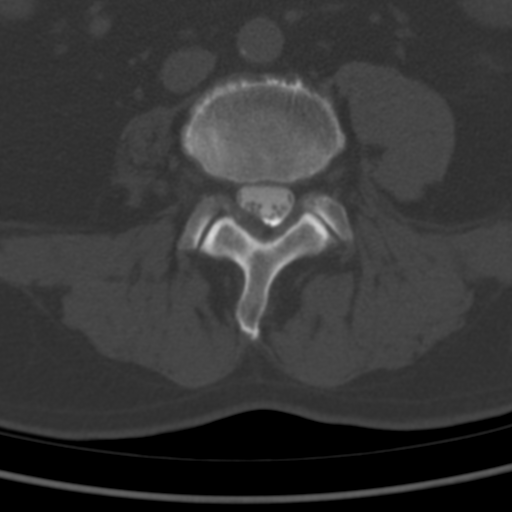
[im 38/76  bone]
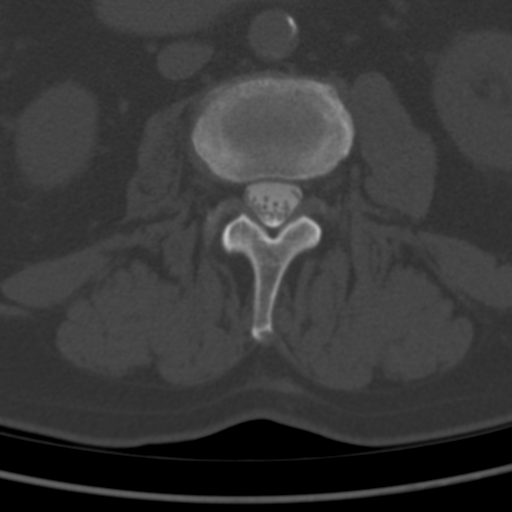
[im 51/76  bone]
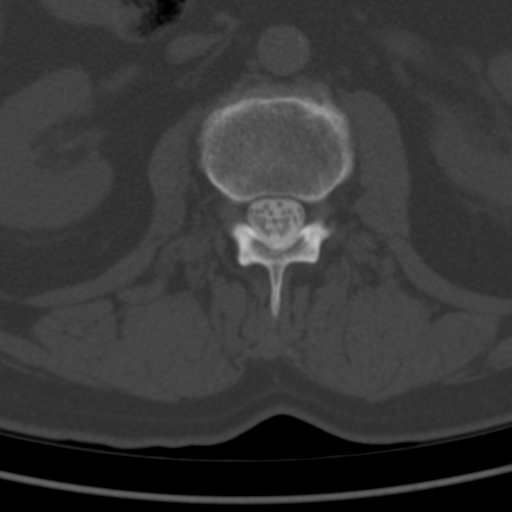
[im 63/76  soft-tissue]
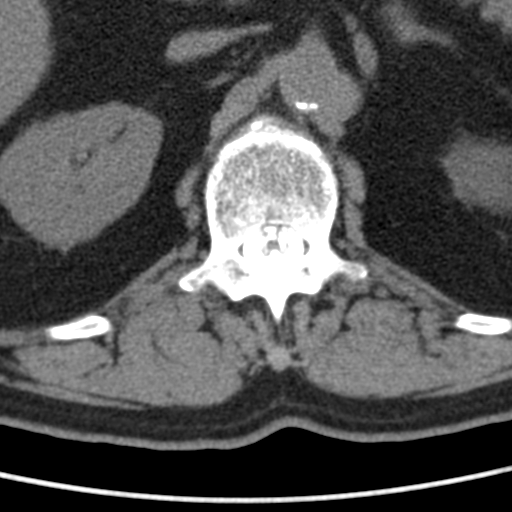
[im 63/76  bone]
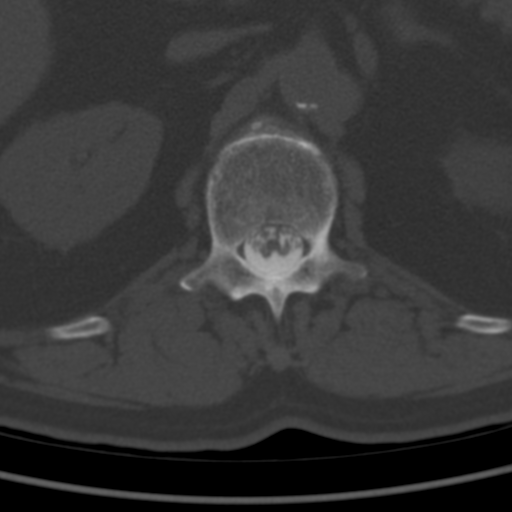

[5 of 14 positions shown; findings below may reference images not displayed]

EXAM:
LUMBAR MYELOGRAM

FLUOROSCOPY TIME:  Radiation Exposure Index (as provided by the
fluoroscopic device): 224.45 uGy*m2

Fluoroscopy Time:  12 seconds

Number of Acquired Images:  17

PROCEDURE:
After thorough discussion of risks and benefits of the procedure
including bleeding, infection, injury to nerves, blood vessels,
adjacent structures as well as headache and CSF leak, written and
oral informed consent was obtained. Consent was obtained by Dr.
Odisseas Zace. Time out form was completed.

Patient was positioned prone on the fluoroscopy table. Local
anesthesia was provided with 1% lidocaine without epinephrine after
prepped and draped in the usual sterile fashion. Puncture was
performed at L3-4 using a 3 1/2 inch 22-gauge spinal needle via left
paramedian approach. Using a single pass through the dura, the
needle was placed within the thecal sac, with return of clear CSF.
15 mL of Isovue N-C66 was injected into the thecal sac, with normal
opacification of the nerve roots and cauda equina consistent with
free flow within the subarachnoid space.

I personally performed the lumbar puncture and administered the
intrathecal contrast. I also personally supervised acquisition of
the myelogram images.
FINDINGS: LUMBAR MYELOGRAM FINDINGS:

Five non rib-bearing lumbar type vertebral bodies are present. Mild
disc bulging is again noted at L2-3. Nerve roots fill normally on
both sides.

Slight disc bulging is evident L3-4, L4-5, and L5-S1 with standing.
There is no significant change or abnormal motion with flexion or
extension.

CT LUMBAR MYELOGRAM FINDINGS:

The lumbar spine is imaged from the midbody of T12 through S2-3.
Vertebral body heights are maintained.

Atherosclerotic calcifications are present in the aorta without
aneurysm. There is a nonobstructing 5 mm stone at the lower pole of
the right kidney. Asymmetric atrophy is present within the right
psoas muscle. Limited imaging of the abdomen is otherwise
unremarkable. There is no significant adenopathy.

L1-2:  Negative.

L2-3: A mild broad-based disc protrusion is present. No significant
central stenosis is present. Mild right foraminal narrowing is
present.

L3-4: Mild disc bulging is asymmetric to the right. There is no
significant stenosis.

L4-5: A posterior synovial cyst is present right. This does not
result in significant stenosis. The disc extends into both neural
foramina without significant stenosis.

L5-S1: Mild leftward disc bulging is present without significant
stenosis.
IMPRESSION: 1. Asymmetric right-sided right-sided facet hypertrophy at L4-5 with
a synovial cyst encroachment posterior right side of the canal but
without significant stenosis.
2. Mild disc bulging and facet hypertrophy at L2-3 without
significant stenosis centrally.
3. Mild right foraminal narrowing at L2-3.
4. Mild disc bulging is asymmetric to the right at L3-4 without
significant stenosis.
5. Mild leftward disc bulging at L5-S1 without significant stenosis.
6. 5 mm nonobstructing stone at the lower pole of the right kidney.
7.  Aortic Atherosclerosis (O4RKV-UIE.E).

## 2018-05-18 IMAGING — CR DG MYELOGRAPHY LUMBAR INJ LUMBOSACRAL
13 of 18 series · 13 of 18 positions shown · non-contrast
Comparison: MRI of the lumbar spine 09/17/2017 at [REDACTED].

CLINICAL DATA: Right lower extremity radiculopathy. Known synovial
cyst on the right at L4-5.
TECHNIQUE: Contiguous axial images were obtained through the Lumbar spine after
the intrathecal infusion of infusion. Coronal and sagittal
reconstructions were obtained of the axial image sets.

[w lumbar spine lat]
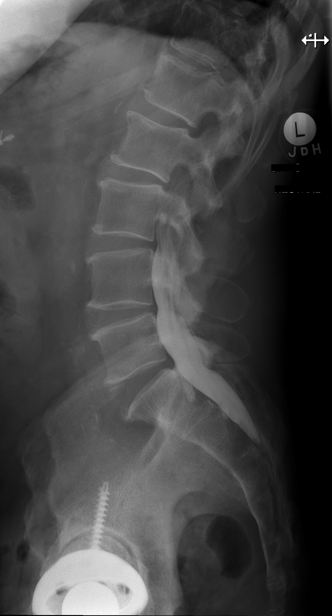

[vasc standard (1 of 11)]
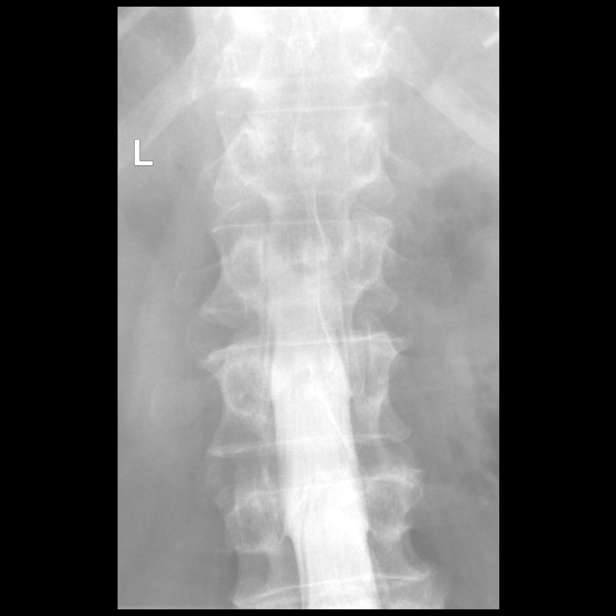

[w lumbar spine flexion]
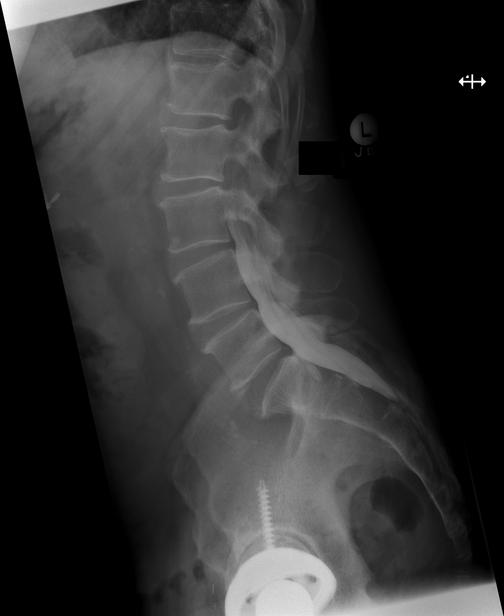

[vasc standard (2 of 11)]
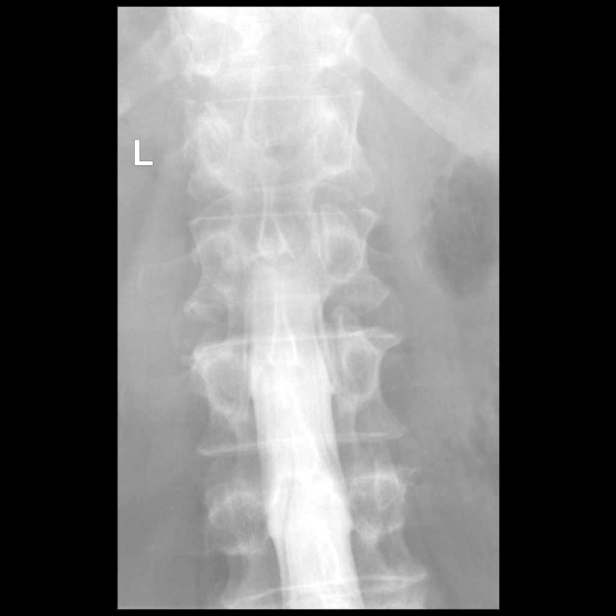

[vasc standard (3 of 11)]
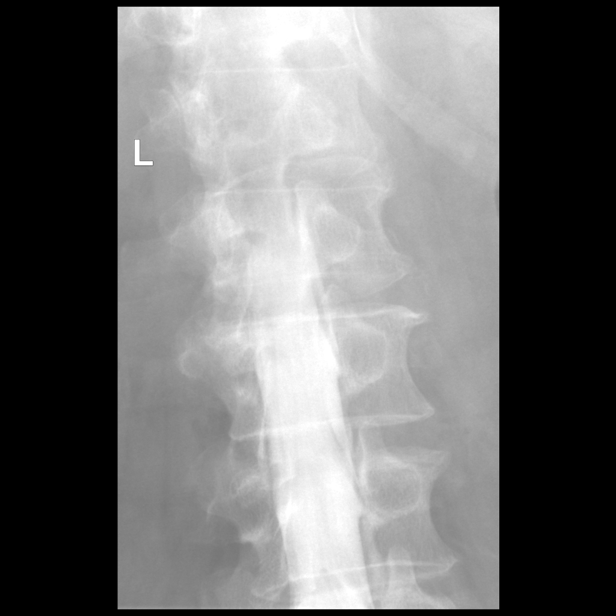

[vasc standard (4 of 11)]
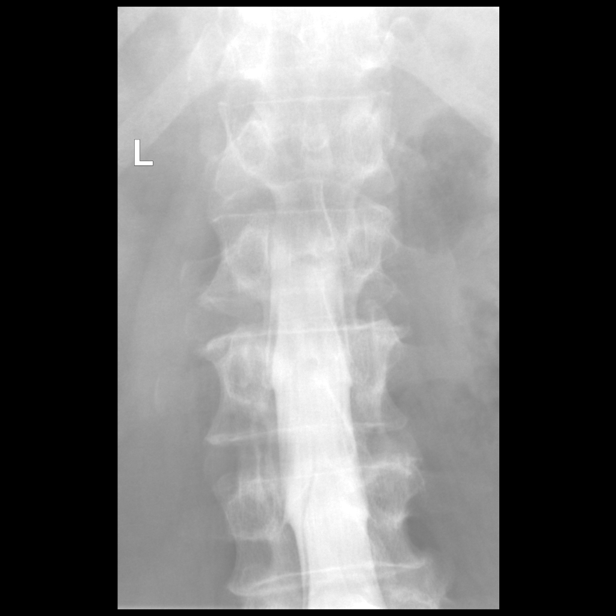

[vasc standard (5 of 11)]
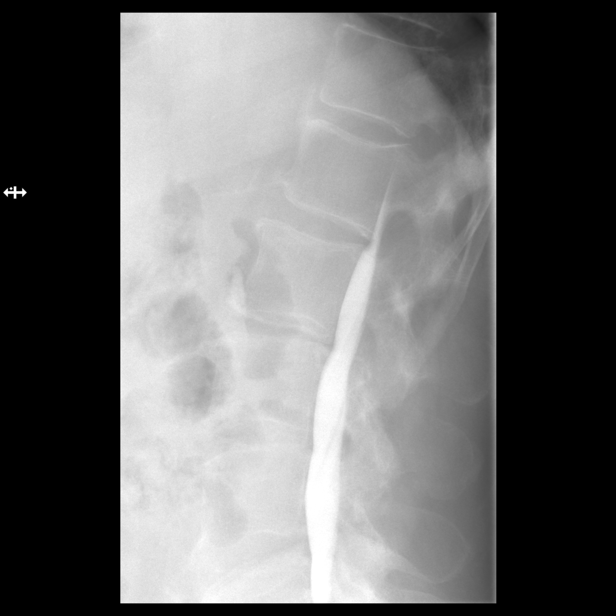

[vasc standard (6 of 11)]
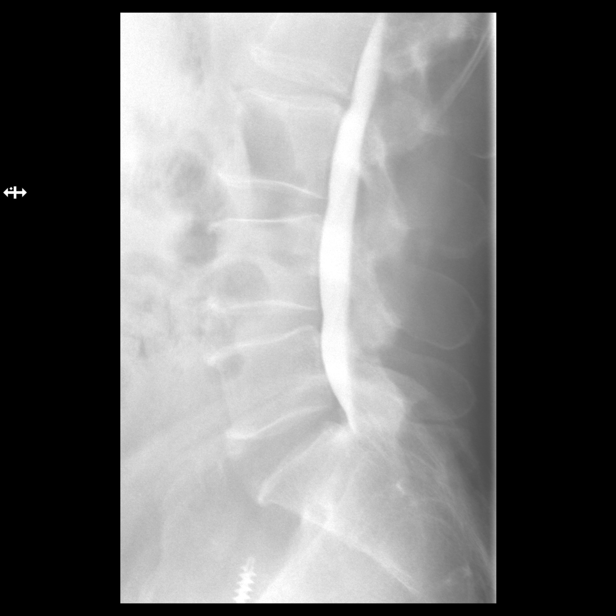

[vasc standard (7 of 11)]
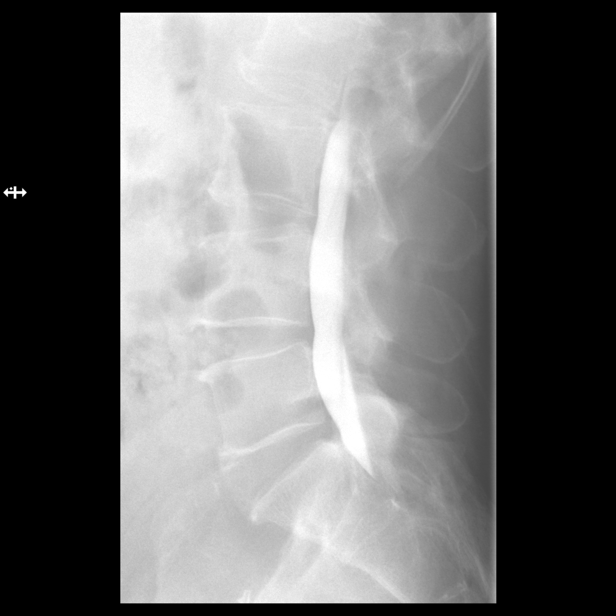

[vasc standard (8 of 11)]
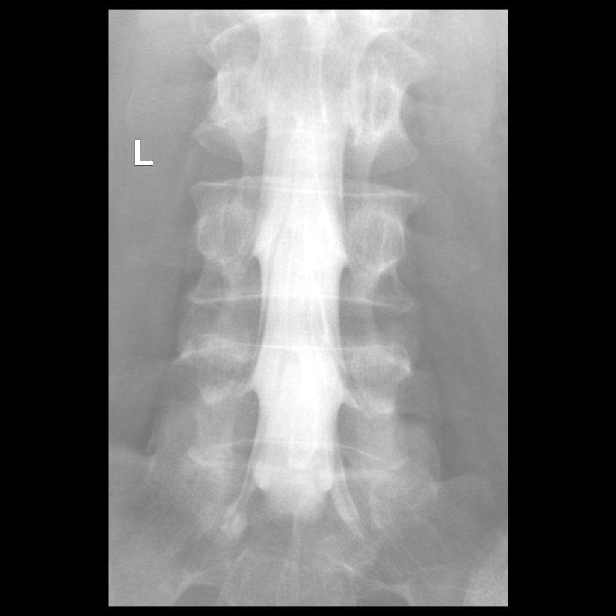

[vasc standard (9 of 11)]
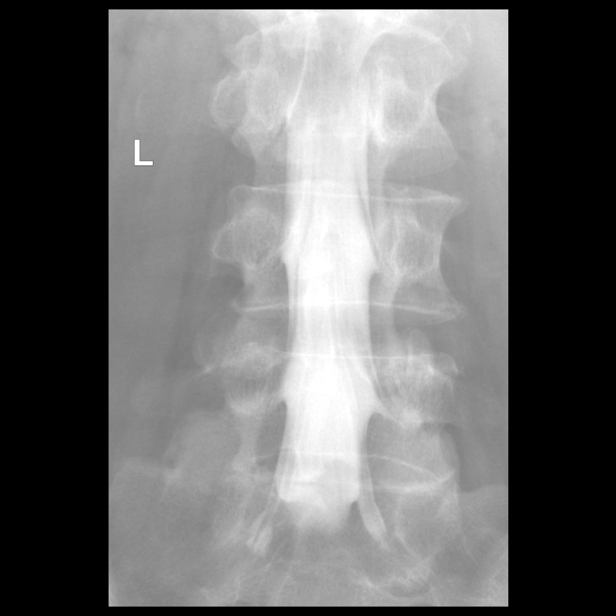

[vasc standard (10 of 11)]
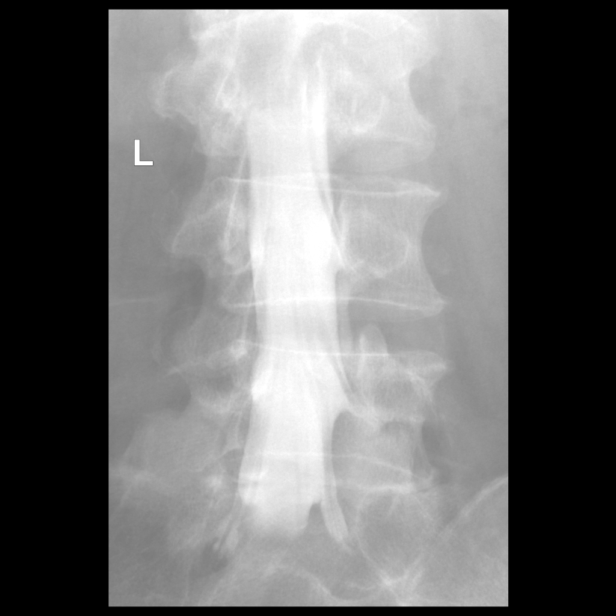

[vasc standard (11 of 11)]
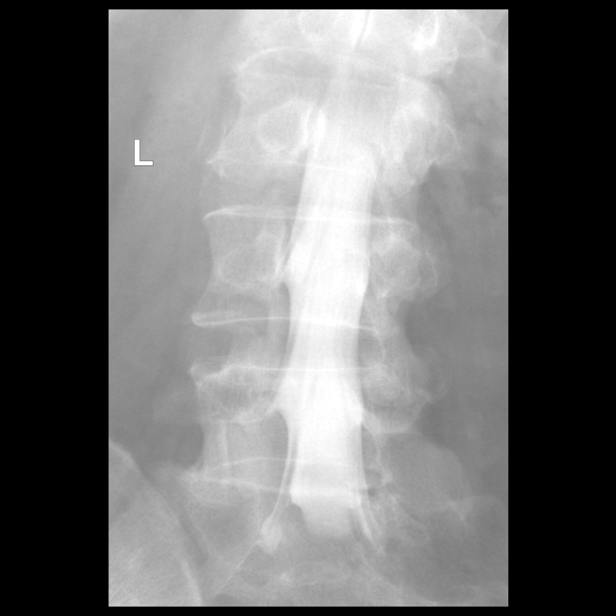

[13 of 18 positions shown; findings below may reference images not displayed]

EXAM:
LUMBAR MYELOGRAM

FLUOROSCOPY TIME:  Radiation Exposure Index (as provided by the
fluoroscopic device): 224.45 uGy*m2

Fluoroscopy Time:  12 seconds

Number of Acquired Images:  17

PROCEDURE:
After thorough discussion of risks and benefits of the procedure
including bleeding, infection, injury to nerves, blood vessels,
adjacent structures as well as headache and CSF leak, written and
oral informed consent was obtained. Consent was obtained by Dr.
Odisseas Zace. Time out form was completed.

Patient was positioned prone on the fluoroscopy table. Local
anesthesia was provided with 1% lidocaine without epinephrine after
prepped and draped in the usual sterile fashion. Puncture was
performed at L3-4 using a 3 1/2 inch 22-gauge spinal needle via left
paramedian approach. Using a single pass through the dura, the
needle was placed within the thecal sac, with return of clear CSF.
15 mL of Isovue N-C66 was injected into the thecal sac, with normal
opacification of the nerve roots and cauda equina consistent with
free flow within the subarachnoid space.

I personally performed the lumbar puncture and administered the
intrathecal contrast. I also personally supervised acquisition of
the myelogram images.
FINDINGS: LUMBAR MYELOGRAM FINDINGS:

Five non rib-bearing lumbar type vertebral bodies are present. Mild
disc bulging is again noted at L2-3. Nerve roots fill normally on
both sides.

Slight disc bulging is evident L3-4, L4-5, and L5-S1 with standing.
There is no significant change or abnormal motion with flexion or
extension.

CT LUMBAR MYELOGRAM FINDINGS:

The lumbar spine is imaged from the midbody of T12 through S2-3.
Vertebral body heights are maintained.

Atherosclerotic calcifications are present in the aorta without
aneurysm. There is a nonobstructing 5 mm stone at the lower pole of
the right kidney. Asymmetric atrophy is present within the right
psoas muscle. Limited imaging of the abdomen is otherwise
unremarkable. There is no significant adenopathy.

L1-2:  Negative.

L2-3: A mild broad-based disc protrusion is present. No significant
central stenosis is present. Mild right foraminal narrowing is
present.

L3-4: Mild disc bulging is asymmetric to the right. There is no
significant stenosis.

L4-5: A posterior synovial cyst is present right. This does not
result in significant stenosis. The disc extends into both neural
foramina without significant stenosis.

L5-S1: Mild leftward disc bulging is present without significant
stenosis.
IMPRESSION: 1. Asymmetric right-sided right-sided facet hypertrophy at L4-5 with
a synovial cyst encroachment posterior right side of the canal but
without significant stenosis.
2. Mild disc bulging and facet hypertrophy at L2-3 without
significant stenosis centrally.
3. Mild right foraminal narrowing at L2-3.
4. Mild disc bulging is asymmetric to the right at L3-4 without
significant stenosis.
5. Mild leftward disc bulging at L5-S1 without significant stenosis.
6. 5 mm nonobstructing stone at the lower pole of the right kidney.
7.  Aortic Atherosclerosis ([id number]-UIE.E).

## 2018-05-22 ENCOUNTER — Other Ambulatory Visit: Payer: Medicare Other

## 2018-06-09 DIAGNOSIS — M2550 Pain in unspecified joint: Secondary | ICD-10-CM | POA: Diagnosis not present

## 2018-06-09 DIAGNOSIS — M4722 Other spondylosis with radiculopathy, cervical region: Secondary | ICD-10-CM | POA: Diagnosis not present

## 2018-06-09 DIAGNOSIS — M50121 Cervical disc disorder at C4-C5 level with radiculopathy: Secondary | ICD-10-CM | POA: Diagnosis not present

## 2018-06-09 DIAGNOSIS — M5136 Other intervertebral disc degeneration, lumbar region: Secondary | ICD-10-CM | POA: Diagnosis not present

## 2018-06-09 DIAGNOSIS — Z79891 Long term (current) use of opiate analgesic: Secondary | ICD-10-CM | POA: Diagnosis not present

## 2018-06-09 DIAGNOSIS — M545 Low back pain: Secondary | ICD-10-CM | POA: Diagnosis not present

## 2018-06-09 DIAGNOSIS — M47816 Spondylosis without myelopathy or radiculopathy, lumbar region: Secondary | ICD-10-CM | POA: Diagnosis not present

## 2018-06-09 DIAGNOSIS — G894 Chronic pain syndrome: Secondary | ICD-10-CM | POA: Diagnosis not present

## 2018-06-09 DIAGNOSIS — M50122 Cervical disc disorder at C5-C6 level with radiculopathy: Secondary | ICD-10-CM | POA: Diagnosis not present

## 2018-06-09 DIAGNOSIS — M1711 Unilateral primary osteoarthritis, right knee: Secondary | ICD-10-CM | POA: Diagnosis not present

## 2018-07-01 DIAGNOSIS — M47816 Spondylosis without myelopathy or radiculopathy, lumbar region: Secondary | ICD-10-CM | POA: Diagnosis not present

## 2018-07-01 DIAGNOSIS — M1711 Unilateral primary osteoarthritis, right knee: Secondary | ICD-10-CM | POA: Diagnosis not present

## 2018-07-01 DIAGNOSIS — M5136 Other intervertebral disc degeneration, lumbar region: Secondary | ICD-10-CM | POA: Diagnosis not present

## 2018-07-01 DIAGNOSIS — G894 Chronic pain syndrome: Secondary | ICD-10-CM | POA: Diagnosis not present

## 2018-07-01 DIAGNOSIS — M545 Low back pain: Secondary | ICD-10-CM | POA: Diagnosis not present

## 2018-07-02 DIAGNOSIS — Z6829 Body mass index (BMI) 29.0-29.9, adult: Secondary | ICD-10-CM | POA: Diagnosis not present

## 2018-07-02 DIAGNOSIS — J209 Acute bronchitis, unspecified: Secondary | ICD-10-CM | POA: Diagnosis not present

## 2018-07-02 DIAGNOSIS — J0111 Acute recurrent frontal sinusitis: Secondary | ICD-10-CM | POA: Diagnosis not present

## 2018-07-11 DIAGNOSIS — J0111 Acute recurrent frontal sinusitis: Secondary | ICD-10-CM | POA: Diagnosis not present

## 2018-07-11 DIAGNOSIS — Z6828 Body mass index (BMI) 28.0-28.9, adult: Secondary | ICD-10-CM | POA: Diagnosis not present

## 2018-07-11 DIAGNOSIS — J209 Acute bronchitis, unspecified: Secondary | ICD-10-CM | POA: Diagnosis not present

## 2018-07-16 DIAGNOSIS — M1711 Unilateral primary osteoarthritis, right knee: Secondary | ICD-10-CM | POA: Diagnosis not present

## 2018-07-28 DIAGNOSIS — M47816 Spondylosis without myelopathy or radiculopathy, lumbar region: Secondary | ICD-10-CM | POA: Diagnosis not present

## 2018-07-28 DIAGNOSIS — M545 Low back pain: Secondary | ICD-10-CM | POA: Diagnosis not present

## 2018-08-27 DIAGNOSIS — M47816 Spondylosis without myelopathy or radiculopathy, lumbar region: Secondary | ICD-10-CM | POA: Diagnosis not present

## 2018-08-27 DIAGNOSIS — G894 Chronic pain syndrome: Secondary | ICD-10-CM | POA: Diagnosis not present

## 2018-08-27 DIAGNOSIS — M1711 Unilateral primary osteoarthritis, right knee: Secondary | ICD-10-CM | POA: Diagnosis not present

## 2018-09-15 DIAGNOSIS — M47816 Spondylosis without myelopathy or radiculopathy, lumbar region: Secondary | ICD-10-CM | POA: Diagnosis not present

## 2018-09-15 DIAGNOSIS — M545 Low back pain: Secondary | ICD-10-CM | POA: Diagnosis not present

## 2018-09-15 DIAGNOSIS — M5136 Other intervertebral disc degeneration, lumbar region: Secondary | ICD-10-CM | POA: Diagnosis not present

## 2018-10-06 DIAGNOSIS — M47816 Spondylosis without myelopathy or radiculopathy, lumbar region: Secondary | ICD-10-CM | POA: Diagnosis not present

## 2018-10-06 DIAGNOSIS — G894 Chronic pain syndrome: Secondary | ICD-10-CM | POA: Diagnosis not present

## 2018-10-06 DIAGNOSIS — M545 Low back pain: Secondary | ICD-10-CM | POA: Diagnosis not present

## 2018-10-06 DIAGNOSIS — M4722 Other spondylosis with radiculopathy, cervical region: Secondary | ICD-10-CM | POA: Diagnosis not present

## 2018-10-06 DIAGNOSIS — M5136 Other intervertebral disc degeneration, lumbar region: Secondary | ICD-10-CM | POA: Diagnosis not present

## 2018-10-06 DIAGNOSIS — Z79891 Long term (current) use of opiate analgesic: Secondary | ICD-10-CM | POA: Diagnosis not present

## 2018-10-06 DIAGNOSIS — R200 Anesthesia of skin: Secondary | ICD-10-CM | POA: Diagnosis not present

## 2018-10-06 DIAGNOSIS — R202 Paresthesia of skin: Secondary | ICD-10-CM | POA: Diagnosis not present

## 2018-10-28 DIAGNOSIS — F17210 Nicotine dependence, cigarettes, uncomplicated: Secondary | ICD-10-CM | POA: Diagnosis not present

## 2018-10-28 DIAGNOSIS — E785 Hyperlipidemia, unspecified: Secondary | ICD-10-CM | POA: Diagnosis not present

## 2018-10-28 DIAGNOSIS — M47816 Spondylosis without myelopathy or radiculopathy, lumbar region: Secondary | ICD-10-CM | POA: Diagnosis not present

## 2018-10-28 DIAGNOSIS — Z79899 Other long term (current) drug therapy: Secondary | ICD-10-CM | POA: Diagnosis not present

## 2018-10-28 DIAGNOSIS — Z886 Allergy status to analgesic agent status: Secondary | ICD-10-CM | POA: Diagnosis not present

## 2018-10-28 DIAGNOSIS — I10 Essential (primary) hypertension: Secondary | ICD-10-CM | POA: Diagnosis not present

## 2018-10-28 DIAGNOSIS — Z7984 Long term (current) use of oral hypoglycemic drugs: Secondary | ICD-10-CM | POA: Diagnosis not present

## 2018-10-28 DIAGNOSIS — Z888 Allergy status to other drugs, medicaments and biological substances status: Secondary | ICD-10-CM | POA: Diagnosis not present

## 2018-11-13 DIAGNOSIS — M4722 Other spondylosis with radiculopathy, cervical region: Secondary | ICD-10-CM | POA: Diagnosis not present

## 2018-11-13 DIAGNOSIS — M5136 Other intervertebral disc degeneration, lumbar region: Secondary | ICD-10-CM | POA: Diagnosis not present

## 2018-11-13 DIAGNOSIS — M9981 Other biomechanical lesions of cervical region: Secondary | ICD-10-CM | POA: Diagnosis not present

## 2018-11-13 DIAGNOSIS — Z79891 Long term (current) use of opiate analgesic: Secondary | ICD-10-CM | POA: Diagnosis not present

## 2018-11-13 DIAGNOSIS — G894 Chronic pain syndrome: Secondary | ICD-10-CM | POA: Diagnosis not present

## 2018-11-13 DIAGNOSIS — M47816 Spondylosis without myelopathy or radiculopathy, lumbar region: Secondary | ICD-10-CM | POA: Diagnosis not present

## 2018-12-02 DIAGNOSIS — M5030 Other cervical disc degeneration, unspecified cervical region: Secondary | ICD-10-CM | POA: Diagnosis not present

## 2018-12-02 DIAGNOSIS — M47812 Spondylosis without myelopathy or radiculopathy, cervical region: Secondary | ICD-10-CM | POA: Diagnosis not present

## 2018-12-31 DIAGNOSIS — M47816 Spondylosis without myelopathy or radiculopathy, lumbar region: Secondary | ICD-10-CM | POA: Diagnosis not present

## 2018-12-31 DIAGNOSIS — M7918 Myalgia, other site: Secondary | ICD-10-CM | POA: Diagnosis not present

## 2018-12-31 DIAGNOSIS — M47812 Spondylosis without myelopathy or radiculopathy, cervical region: Secondary | ICD-10-CM | POA: Diagnosis not present

## 2018-12-31 DIAGNOSIS — M5030 Other cervical disc degeneration, unspecified cervical region: Secondary | ICD-10-CM | POA: Diagnosis not present

## 2018-12-31 DIAGNOSIS — G894 Chronic pain syndrome: Secondary | ICD-10-CM | POA: Diagnosis not present

## 2018-12-31 DIAGNOSIS — M9981 Other biomechanical lesions of cervical region: Secondary | ICD-10-CM | POA: Diagnosis not present

## 2018-12-31 DIAGNOSIS — Z79891 Long term (current) use of opiate analgesic: Secondary | ICD-10-CM | POA: Diagnosis not present

## 2019-01-14 DIAGNOSIS — Z5181 Encounter for therapeutic drug level monitoring: Secondary | ICD-10-CM | POA: Diagnosis not present

## 2019-01-14 DIAGNOSIS — M5416 Radiculopathy, lumbar region: Secondary | ICD-10-CM | POA: Diagnosis not present

## 2019-01-14 DIAGNOSIS — R202 Paresthesia of skin: Secondary | ICD-10-CM | POA: Diagnosis not present

## 2019-01-14 DIAGNOSIS — M4726 Other spondylosis with radiculopathy, lumbar region: Secondary | ICD-10-CM | POA: Diagnosis not present

## 2019-01-14 DIAGNOSIS — Z79891 Long term (current) use of opiate analgesic: Secondary | ICD-10-CM | POA: Diagnosis not present

## 2019-01-14 DIAGNOSIS — R200 Anesthesia of skin: Secondary | ICD-10-CM | POA: Diagnosis not present

## 2019-01-14 DIAGNOSIS — M47816 Spondylosis without myelopathy or radiculopathy, lumbar region: Secondary | ICD-10-CM | POA: Diagnosis not present

## 2019-01-14 DIAGNOSIS — Z96641 Presence of right artificial hip joint: Secondary | ICD-10-CM | POA: Diagnosis not present

## 2019-01-14 DIAGNOSIS — G894 Chronic pain syndrome: Secondary | ICD-10-CM | POA: Diagnosis not present

## 2019-01-14 DIAGNOSIS — M25551 Pain in right hip: Secondary | ICD-10-CM | POA: Diagnosis not present

## 2019-02-25 DIAGNOSIS — M25551 Pain in right hip: Secondary | ICD-10-CM | POA: Diagnosis not present

## 2019-02-25 DIAGNOSIS — M5416 Radiculopathy, lumbar region: Secondary | ICD-10-CM | POA: Diagnosis not present

## 2019-02-25 DIAGNOSIS — Z96641 Presence of right artificial hip joint: Secondary | ICD-10-CM | POA: Diagnosis not present

## 2019-02-25 DIAGNOSIS — M47816 Spondylosis without myelopathy or radiculopathy, lumbar region: Secondary | ICD-10-CM | POA: Diagnosis not present

## 2019-02-25 DIAGNOSIS — G894 Chronic pain syndrome: Secondary | ICD-10-CM | POA: Diagnosis not present

## 2019-02-25 DIAGNOSIS — Z5181 Encounter for therapeutic drug level monitoring: Secondary | ICD-10-CM | POA: Diagnosis not present

## 2019-02-25 DIAGNOSIS — Z79891 Long term (current) use of opiate analgesic: Secondary | ICD-10-CM | POA: Diagnosis not present

## 2019-02-26 DIAGNOSIS — M4722 Other spondylosis with radiculopathy, cervical region: Secondary | ICD-10-CM | POA: Diagnosis not present

## 2019-02-26 DIAGNOSIS — G5621 Lesion of ulnar nerve, right upper limb: Secondary | ICD-10-CM | POA: Diagnosis not present

## 2019-02-26 DIAGNOSIS — G5622 Lesion of ulnar nerve, left upper limb: Secondary | ICD-10-CM | POA: Diagnosis not present

## 2019-03-11 DIAGNOSIS — Z79891 Long term (current) use of opiate analgesic: Secondary | ICD-10-CM | POA: Diagnosis not present

## 2019-03-11 DIAGNOSIS — Z5181 Encounter for therapeutic drug level monitoring: Secondary | ICD-10-CM | POA: Diagnosis not present

## 2019-03-11 DIAGNOSIS — G894 Chronic pain syndrome: Secondary | ICD-10-CM | POA: Diagnosis not present

## 2019-03-11 DIAGNOSIS — M47816 Spondylosis without myelopathy or radiculopathy, lumbar region: Secondary | ICD-10-CM | POA: Diagnosis not present

## 2019-03-11 DIAGNOSIS — M7918 Myalgia, other site: Secondary | ICD-10-CM | POA: Diagnosis not present

## 2019-03-11 DIAGNOSIS — M25551 Pain in right hip: Secondary | ICD-10-CM | POA: Diagnosis not present

## 2019-03-11 DIAGNOSIS — M5416 Radiculopathy, lumbar region: Secondary | ICD-10-CM | POA: Diagnosis not present

## 2019-03-31 DIAGNOSIS — M5030 Other cervical disc degeneration, unspecified cervical region: Secondary | ICD-10-CM | POA: Diagnosis not present

## 2019-03-31 DIAGNOSIS — M4722 Other spondylosis with radiculopathy, cervical region: Secondary | ICD-10-CM | POA: Diagnosis not present

## 2019-03-31 DIAGNOSIS — M9981 Other biomechanical lesions of cervical region: Secondary | ICD-10-CM | POA: Diagnosis not present

## 2019-04-23 DIAGNOSIS — M47816 Spondylosis without myelopathy or radiculopathy, lumbar region: Secondary | ICD-10-CM | POA: Diagnosis not present

## 2019-04-23 DIAGNOSIS — M5030 Other cervical disc degeneration, unspecified cervical region: Secondary | ICD-10-CM | POA: Diagnosis not present

## 2019-04-23 DIAGNOSIS — M5136 Other intervertebral disc degeneration, lumbar region: Secondary | ICD-10-CM | POA: Diagnosis not present

## 2019-04-23 DIAGNOSIS — Z5181 Encounter for therapeutic drug level monitoring: Secondary | ICD-10-CM | POA: Diagnosis not present

## 2019-04-23 DIAGNOSIS — M47817 Spondylosis without myelopathy or radiculopathy, lumbosacral region: Secondary | ICD-10-CM | POA: Diagnosis not present

## 2019-04-23 DIAGNOSIS — M545 Low back pain: Secondary | ICD-10-CM | POA: Diagnosis not present

## 2019-04-23 DIAGNOSIS — M9981 Other biomechanical lesions of cervical region: Secondary | ICD-10-CM | POA: Diagnosis not present

## 2019-04-23 DIAGNOSIS — M5416 Radiculopathy, lumbar region: Secondary | ICD-10-CM | POA: Diagnosis not present

## 2019-04-23 DIAGNOSIS — Z79891 Long term (current) use of opiate analgesic: Secondary | ICD-10-CM | POA: Diagnosis not present

## 2019-04-23 DIAGNOSIS — G894 Chronic pain syndrome: Secondary | ICD-10-CM | POA: Diagnosis not present

## 2019-05-07 DIAGNOSIS — E7800 Pure hypercholesterolemia, unspecified: Secondary | ICD-10-CM | POA: Diagnosis not present

## 2019-05-07 DIAGNOSIS — D519 Vitamin B12 deficiency anemia, unspecified: Secondary | ICD-10-CM | POA: Diagnosis not present

## 2019-05-07 DIAGNOSIS — E1165 Type 2 diabetes mellitus with hyperglycemia: Secondary | ICD-10-CM | POA: Diagnosis not present

## 2019-05-11 DIAGNOSIS — Z720 Tobacco use: Secondary | ICD-10-CM | POA: Diagnosis not present

## 2019-05-11 DIAGNOSIS — M545 Low back pain: Secondary | ICD-10-CM | POA: Diagnosis not present

## 2019-05-11 DIAGNOSIS — M1990 Unspecified osteoarthritis, unspecified site: Secondary | ICD-10-CM | POA: Diagnosis not present

## 2019-05-11 DIAGNOSIS — Z6829 Body mass index (BMI) 29.0-29.9, adult: Secondary | ICD-10-CM | POA: Diagnosis not present

## 2019-05-11 DIAGNOSIS — M4802 Spinal stenosis, cervical region: Secondary | ICD-10-CM | POA: Diagnosis not present

## 2019-05-11 DIAGNOSIS — E1140 Type 2 diabetes mellitus with diabetic neuropathy, unspecified: Secondary | ICD-10-CM | POA: Diagnosis not present

## 2019-07-29 DIAGNOSIS — Z79891 Long term (current) use of opiate analgesic: Secondary | ICD-10-CM | POA: Diagnosis not present

## 2019-07-29 DIAGNOSIS — M47816 Spondylosis without myelopathy or radiculopathy, lumbar region: Secondary | ICD-10-CM | POA: Diagnosis not present

## 2019-07-29 DIAGNOSIS — Z5181 Encounter for therapeutic drug level monitoring: Secondary | ICD-10-CM | POA: Diagnosis not present

## 2019-07-29 DIAGNOSIS — M5030 Other cervical disc degeneration, unspecified cervical region: Secondary | ICD-10-CM | POA: Diagnosis not present

## 2019-07-29 DIAGNOSIS — G894 Chronic pain syndrome: Secondary | ICD-10-CM | POA: Diagnosis not present

## 2019-07-29 DIAGNOSIS — G5621 Lesion of ulnar nerve, right upper limb: Secondary | ICD-10-CM | POA: Diagnosis not present

## 2019-09-17 DIAGNOSIS — S52121A Displaced fracture of head of right radius, initial encounter for closed fracture: Secondary | ICD-10-CM | POA: Diagnosis not present

## 2019-09-17 DIAGNOSIS — M25511 Pain in right shoulder: Secondary | ICD-10-CM | POA: Diagnosis not present

## 2019-09-17 DIAGNOSIS — Z6828 Body mass index (BMI) 28.0-28.9, adult: Secondary | ICD-10-CM | POA: Diagnosis not present

## 2019-09-18 DIAGNOSIS — Z79891 Long term (current) use of opiate analgesic: Secondary | ICD-10-CM | POA: Diagnosis not present

## 2019-09-18 DIAGNOSIS — Z5181 Encounter for therapeutic drug level monitoring: Secondary | ICD-10-CM | POA: Diagnosis not present

## 2019-09-18 DIAGNOSIS — M5030 Other cervical disc degeneration, unspecified cervical region: Secondary | ICD-10-CM | POA: Diagnosis not present

## 2019-09-18 DIAGNOSIS — M1711 Unilateral primary osteoarthritis, right knee: Secondary | ICD-10-CM | POA: Diagnosis not present

## 2019-09-18 DIAGNOSIS — M47816 Spondylosis without myelopathy or radiculopathy, lumbar region: Secondary | ICD-10-CM | POA: Diagnosis not present

## 2019-09-18 DIAGNOSIS — G894 Chronic pain syndrome: Secondary | ICD-10-CM | POA: Diagnosis not present

## 2019-09-25 DIAGNOSIS — S52121A Displaced fracture of head of right radius, initial encounter for closed fracture: Secondary | ICD-10-CM | POA: Diagnosis not present

## 2019-11-03 DIAGNOSIS — D519 Vitamin B12 deficiency anemia, unspecified: Secondary | ICD-10-CM | POA: Diagnosis not present

## 2019-11-03 DIAGNOSIS — Z720 Tobacco use: Secondary | ICD-10-CM | POA: Diagnosis not present

## 2019-11-03 DIAGNOSIS — E1165 Type 2 diabetes mellitus with hyperglycemia: Secondary | ICD-10-CM | POA: Diagnosis not present

## 2019-11-03 DIAGNOSIS — E1140 Type 2 diabetes mellitus with diabetic neuropathy, unspecified: Secondary | ICD-10-CM | POA: Diagnosis not present

## 2019-11-03 DIAGNOSIS — E7800 Pure hypercholesterolemia, unspecified: Secondary | ICD-10-CM | POA: Diagnosis not present

## 2019-12-23 DIAGNOSIS — G894 Chronic pain syndrome: Secondary | ICD-10-CM | POA: Diagnosis not present

## 2019-12-23 DIAGNOSIS — G5621 Lesion of ulnar nerve, right upper limb: Secondary | ICD-10-CM | POA: Diagnosis not present

## 2019-12-23 DIAGNOSIS — M7918 Myalgia, other site: Secondary | ICD-10-CM | POA: Diagnosis not present

## 2019-12-23 DIAGNOSIS — M5030 Other cervical disc degeneration, unspecified cervical region: Secondary | ICD-10-CM | POA: Diagnosis not present

## 2019-12-23 DIAGNOSIS — M47816 Spondylosis without myelopathy or radiculopathy, lumbar region: Secondary | ICD-10-CM | POA: Diagnosis not present

## 2019-12-25 DIAGNOSIS — G5621 Lesion of ulnar nerve, right upper limb: Secondary | ICD-10-CM | POA: Diagnosis not present

## 2020-01-25 DIAGNOSIS — M7918 Myalgia, other site: Secondary | ICD-10-CM | POA: Diagnosis not present

## 2020-01-25 DIAGNOSIS — G5621 Lesion of ulnar nerve, right upper limb: Secondary | ICD-10-CM | POA: Diagnosis not present

## 2020-01-25 DIAGNOSIS — M47816 Spondylosis without myelopathy or radiculopathy, lumbar region: Secondary | ICD-10-CM | POA: Diagnosis not present

## 2020-02-03 DIAGNOSIS — G8929 Other chronic pain: Secondary | ICD-10-CM | POA: Diagnosis not present

## 2020-02-03 DIAGNOSIS — M7918 Myalgia, other site: Secondary | ICD-10-CM | POA: Diagnosis not present

## 2020-02-17 DIAGNOSIS — G5621 Lesion of ulnar nerve, right upper limb: Secondary | ICD-10-CM | POA: Diagnosis not present

## 2020-04-13 DIAGNOSIS — Z720 Tobacco use: Secondary | ICD-10-CM | POA: Diagnosis not present

## 2020-04-13 DIAGNOSIS — J40 Bronchitis, not specified as acute or chronic: Secondary | ICD-10-CM | POA: Diagnosis not present

## 2020-05-09 DIAGNOSIS — D510 Vitamin B12 deficiency anemia due to intrinsic factor deficiency: Secondary | ICD-10-CM | POA: Diagnosis not present

## 2020-05-09 DIAGNOSIS — M1990 Unspecified osteoarthritis, unspecified site: Secondary | ICD-10-CM | POA: Diagnosis not present

## 2020-05-09 DIAGNOSIS — Z20828 Contact with and (suspected) exposure to other viral communicable diseases: Secondary | ICD-10-CM | POA: Diagnosis not present

## 2020-05-09 DIAGNOSIS — W57XXXA Bitten or stung by nonvenomous insect and other nonvenomous arthropods, initial encounter: Secondary | ICD-10-CM | POA: Diagnosis not present

## 2020-05-09 DIAGNOSIS — M545 Low back pain: Secondary | ICD-10-CM | POA: Diagnosis not present

## 2020-05-09 DIAGNOSIS — R05 Cough: Secondary | ICD-10-CM | POA: Diagnosis not present

## 2020-05-09 DIAGNOSIS — E1140 Type 2 diabetes mellitus with diabetic neuropathy, unspecified: Secondary | ICD-10-CM | POA: Diagnosis not present

## 2020-05-09 DIAGNOSIS — Z6829 Body mass index (BMI) 29.0-29.9, adult: Secondary | ICD-10-CM | POA: Diagnosis not present

## 2020-05-09 DIAGNOSIS — M4802 Spinal stenosis, cervical region: Secondary | ICD-10-CM | POA: Diagnosis not present

## 2020-05-09 DIAGNOSIS — Z720 Tobacco use: Secondary | ICD-10-CM | POA: Diagnosis not present

## 2020-06-02 DIAGNOSIS — M47816 Spondylosis without myelopathy or radiculopathy, lumbar region: Secondary | ICD-10-CM | POA: Diagnosis not present

## 2020-06-02 DIAGNOSIS — M5136 Other intervertebral disc degeneration, lumbar region: Secondary | ICD-10-CM | POA: Diagnosis not present

## 2020-07-06 DIAGNOSIS — M545 Low back pain: Secondary | ICD-10-CM | POA: Diagnosis not present

## 2020-07-06 DIAGNOSIS — M47816 Spondylosis without myelopathy or radiculopathy, lumbar region: Secondary | ICD-10-CM | POA: Diagnosis not present

## 2020-08-02 DIAGNOSIS — G8929 Other chronic pain: Secondary | ICD-10-CM | POA: Diagnosis not present

## 2020-08-02 DIAGNOSIS — G894 Chronic pain syndrome: Secondary | ICD-10-CM | POA: Diagnosis not present

## 2020-08-02 DIAGNOSIS — M5030 Other cervical disc degeneration, unspecified cervical region: Secondary | ICD-10-CM | POA: Diagnosis not present

## 2020-08-02 DIAGNOSIS — M47816 Spondylosis without myelopathy or radiculopathy, lumbar region: Secondary | ICD-10-CM | POA: Diagnosis not present

## 2020-08-02 DIAGNOSIS — M7918 Myalgia, other site: Secondary | ICD-10-CM | POA: Diagnosis not present

## 2020-08-16 DIAGNOSIS — Z6829 Body mass index (BMI) 29.0-29.9, adult: Secondary | ICD-10-CM | POA: Diagnosis not present

## 2020-08-16 DIAGNOSIS — M79605 Pain in left leg: Secondary | ICD-10-CM | POA: Diagnosis not present

## 2020-08-16 DIAGNOSIS — M7989 Other specified soft tissue disorders: Secondary | ICD-10-CM | POA: Diagnosis not present

## 2020-08-16 DIAGNOSIS — R600 Localized edema: Secondary | ICD-10-CM | POA: Diagnosis not present

## 2020-08-16 DIAGNOSIS — M79671 Pain in right foot: Secondary | ICD-10-CM | POA: Diagnosis not present

## 2020-08-23 DIAGNOSIS — M79671 Pain in right foot: Secondary | ICD-10-CM | POA: Diagnosis not present

## 2020-08-23 DIAGNOSIS — R7989 Other specified abnormal findings of blood chemistry: Secondary | ICD-10-CM | POA: Diagnosis not present

## 2020-08-23 DIAGNOSIS — J449 Chronic obstructive pulmonary disease, unspecified: Secondary | ICD-10-CM | POA: Diagnosis not present

## 2021-05-29 DIAGNOSIS — M5412 Radiculopathy, cervical region: Secondary | ICD-10-CM | POA: Diagnosis not present

## 2021-05-29 DIAGNOSIS — G894 Chronic pain syndrome: Secondary | ICD-10-CM | POA: Diagnosis not present

## 2021-05-29 DIAGNOSIS — Z5181 Encounter for therapeutic drug level monitoring: Secondary | ICD-10-CM | POA: Diagnosis not present

## 2021-05-29 DIAGNOSIS — M5030 Other cervical disc degeneration, unspecified cervical region: Secondary | ICD-10-CM | POA: Diagnosis not present

## 2021-05-29 DIAGNOSIS — M47816 Spondylosis without myelopathy or radiculopathy, lumbar region: Secondary | ICD-10-CM | POA: Diagnosis not present

## 2021-05-29 DIAGNOSIS — Z79891 Long term (current) use of opiate analgesic: Secondary | ICD-10-CM | POA: Diagnosis not present

## 2021-08-25 DIAGNOSIS — G8929 Other chronic pain: Secondary | ICD-10-CM | POA: Diagnosis not present

## 2021-08-25 DIAGNOSIS — G894 Chronic pain syndrome: Secondary | ICD-10-CM | POA: Diagnosis not present

## 2021-08-25 DIAGNOSIS — M5030 Other cervical disc degeneration, unspecified cervical region: Secondary | ICD-10-CM | POA: Diagnosis not present

## 2021-08-25 DIAGNOSIS — M47816 Spondylosis without myelopathy or radiculopathy, lumbar region: Secondary | ICD-10-CM | POA: Diagnosis not present

## 2021-08-25 DIAGNOSIS — M7918 Myalgia, other site: Secondary | ICD-10-CM | POA: Diagnosis not present

## 2021-09-11 DIAGNOSIS — M47816 Spondylosis without myelopathy or radiculopathy, lumbar region: Secondary | ICD-10-CM | POA: Diagnosis not present

## 2021-09-11 DIAGNOSIS — M5136 Other intervertebral disc degeneration, lumbar region: Secondary | ICD-10-CM | POA: Diagnosis not present

## 2021-09-11 DIAGNOSIS — G8929 Other chronic pain: Secondary | ICD-10-CM | POA: Diagnosis not present

## 2021-09-11 DIAGNOSIS — Z888 Allergy status to other drugs, medicaments and biological substances status: Secondary | ICD-10-CM | POA: Diagnosis not present

## 2021-09-11 DIAGNOSIS — M62838 Other muscle spasm: Secondary | ICD-10-CM | POA: Diagnosis not present

## 2021-09-11 DIAGNOSIS — Z96641 Presence of right artificial hip joint: Secondary | ICD-10-CM | POA: Diagnosis not present

## 2021-09-11 DIAGNOSIS — M545 Low back pain, unspecified: Secondary | ICD-10-CM | POA: Diagnosis not present

## 2021-09-11 DIAGNOSIS — S300XXA Contusion of lower back and pelvis, initial encounter: Secondary | ICD-10-CM | POA: Diagnosis not present

## 2021-09-11 DIAGNOSIS — Z041 Encounter for examination and observation following transport accident: Secondary | ICD-10-CM | POA: Diagnosis not present

## 2021-09-11 DIAGNOSIS — I700 Atherosclerosis of aorta: Secondary | ICD-10-CM | POA: Diagnosis not present

## 2021-11-27 DIAGNOSIS — G894 Chronic pain syndrome: Secondary | ICD-10-CM | POA: Diagnosis not present

## 2021-11-27 DIAGNOSIS — M47816 Spondylosis without myelopathy or radiculopathy, lumbar region: Secondary | ICD-10-CM | POA: Diagnosis not present

## 2021-11-27 DIAGNOSIS — M5030 Other cervical disc degeneration, unspecified cervical region: Secondary | ICD-10-CM | POA: Diagnosis not present

## 2021-12-28 DIAGNOSIS — M47816 Spondylosis without myelopathy or radiculopathy, lumbar region: Secondary | ICD-10-CM | POA: Diagnosis not present

## 2022-03-08 DIAGNOSIS — G894 Chronic pain syndrome: Secondary | ICD-10-CM | POA: Diagnosis not present

## 2022-03-08 DIAGNOSIS — M5030 Other cervical disc degeneration, unspecified cervical region: Secondary | ICD-10-CM | POA: Diagnosis not present

## 2022-03-08 DIAGNOSIS — Z79891 Long term (current) use of opiate analgesic: Secondary | ICD-10-CM | POA: Diagnosis not present

## 2022-03-08 DIAGNOSIS — M47816 Spondylosis without myelopathy or radiculopathy, lumbar region: Secondary | ICD-10-CM | POA: Diagnosis not present

## 2022-05-09 DIAGNOSIS — M47816 Spondylosis without myelopathy or radiculopathy, lumbar region: Secondary | ICD-10-CM | POA: Diagnosis not present

## 2022-06-21 DIAGNOSIS — G894 Chronic pain syndrome: Secondary | ICD-10-CM | POA: Diagnosis not present

## 2022-06-21 DIAGNOSIS — Z79891 Long term (current) use of opiate analgesic: Secondary | ICD-10-CM | POA: Diagnosis not present

## 2022-06-21 DIAGNOSIS — M5136 Other intervertebral disc degeneration, lumbar region: Secondary | ICD-10-CM | POA: Diagnosis not present

## 2022-06-21 DIAGNOSIS — M47816 Spondylosis without myelopathy or radiculopathy, lumbar region: Secondary | ICD-10-CM | POA: Diagnosis not present

## 2022-06-25 ENCOUNTER — Other Ambulatory Visit: Payer: Self-pay | Admitting: Nurse Practitioner

## 2022-06-25 ENCOUNTER — Other Ambulatory Visit (HOSPITAL_COMMUNITY): Payer: Self-pay | Admitting: Nurse Practitioner

## 2022-06-25 DIAGNOSIS — M47816 Spondylosis without myelopathy or radiculopathy, lumbar region: Secondary | ICD-10-CM

## 2022-06-25 DIAGNOSIS — M5136 Other intervertebral disc degeneration, lumbar region: Secondary | ICD-10-CM

## 2022-07-09 DIAGNOSIS — M5136 Other intervertebral disc degeneration, lumbar region: Secondary | ICD-10-CM | POA: Diagnosis not present

## 2022-07-09 DIAGNOSIS — M48062 Spinal stenosis, lumbar region with neurogenic claudication: Secondary | ICD-10-CM | POA: Diagnosis not present

## 2022-07-10 ENCOUNTER — Ambulatory Visit (HOSPITAL_COMMUNITY)
Admission: RE | Admit: 2022-07-10 | Discharge: 2022-07-10 | Disposition: A | Discharge status: Home / Self Care | Payer: Medicare HMO | Source: Ambulatory Visit | Attending: Nurse Practitioner | Admitting: Nurse Practitioner

## 2022-07-10 DIAGNOSIS — M5136 Other intervertebral disc degeneration, lumbar region: Secondary | ICD-10-CM | POA: Insufficient documentation

## 2022-07-10 DIAGNOSIS — M4316 Spondylolisthesis, lumbar region: Secondary | ICD-10-CM | POA: Diagnosis not present

## 2022-07-10 DIAGNOSIS — M545 Low back pain, unspecified: Secondary | ICD-10-CM | POA: Diagnosis not present

## 2022-07-10 DIAGNOSIS — M47816 Spondylosis without myelopathy or radiculopathy, lumbar region: Secondary | ICD-10-CM | POA: Diagnosis not present

## 2022-08-23 DIAGNOSIS — G894 Chronic pain syndrome: Secondary | ICD-10-CM | POA: Diagnosis not present

## 2022-08-23 DIAGNOSIS — M5136 Other intervertebral disc degeneration, lumbar region: Secondary | ICD-10-CM | POA: Diagnosis not present

## 2022-08-23 DIAGNOSIS — M461 Sacroiliitis, not elsewhere classified: Secondary | ICD-10-CM | POA: Diagnosis not present

## 2022-08-23 DIAGNOSIS — M47816 Spondylosis without myelopathy or radiculopathy, lumbar region: Secondary | ICD-10-CM | POA: Diagnosis not present

## 2022-10-08 DIAGNOSIS — M461 Sacroiliitis, not elsewhere classified: Secondary | ICD-10-CM | POA: Diagnosis not present

## 2022-11-23 DIAGNOSIS — M461 Sacroiliitis, not elsewhere classified: Secondary | ICD-10-CM | POA: Diagnosis not present

## 2022-11-23 DIAGNOSIS — G894 Chronic pain syndrome: Secondary | ICD-10-CM | POA: Diagnosis not present

## 2022-11-23 DIAGNOSIS — Z79891 Long term (current) use of opiate analgesic: Secondary | ICD-10-CM | POA: Diagnosis not present

## 2022-11-23 DIAGNOSIS — M47816 Spondylosis without myelopathy or radiculopathy, lumbar region: Secondary | ICD-10-CM | POA: Diagnosis not present

## 2022-11-23 DIAGNOSIS — M5136 Other intervertebral disc degeneration, lumbar region: Secondary | ICD-10-CM | POA: Diagnosis not present

## 2023-02-22 DIAGNOSIS — G894 Chronic pain syndrome: Secondary | ICD-10-CM | POA: Diagnosis not present

## 2023-02-22 DIAGNOSIS — M461 Sacroiliitis, not elsewhere classified: Secondary | ICD-10-CM | POA: Diagnosis not present

## 2023-02-22 DIAGNOSIS — M5136 Other intervertebral disc degeneration, lumbar region: Secondary | ICD-10-CM | POA: Diagnosis not present

## 2023-02-22 DIAGNOSIS — M47816 Spondylosis without myelopathy or radiculopathy, lumbar region: Secondary | ICD-10-CM | POA: Diagnosis not present

## 2023-02-22 DIAGNOSIS — Z79891 Long term (current) use of opiate analgesic: Secondary | ICD-10-CM | POA: Diagnosis not present

## 2023-04-01 DIAGNOSIS — M5136 Other intervertebral disc degeneration, lumbar region: Secondary | ICD-10-CM | POA: Diagnosis not present

## 2023-04-01 DIAGNOSIS — M48061 Spinal stenosis, lumbar region without neurogenic claudication: Secondary | ICD-10-CM | POA: Diagnosis not present

## 2023-05-24 DIAGNOSIS — Z79891 Long term (current) use of opiate analgesic: Secondary | ICD-10-CM | POA: Diagnosis not present

## 2023-05-24 DIAGNOSIS — M5136 Other intervertebral disc degeneration, lumbar region: Secondary | ICD-10-CM | POA: Diagnosis not present

## 2023-05-24 DIAGNOSIS — M47816 Spondylosis without myelopathy or radiculopathy, lumbar region: Secondary | ICD-10-CM | POA: Diagnosis not present

## 2023-05-24 DIAGNOSIS — M48061 Spinal stenosis, lumbar region without neurogenic claudication: Secondary | ICD-10-CM | POA: Diagnosis not present

## 2023-05-24 DIAGNOSIS — G894 Chronic pain syndrome: Secondary | ICD-10-CM | POA: Diagnosis not present

## 2023-08-26 DIAGNOSIS — M47816 Spondylosis without myelopathy or radiculopathy, lumbar region: Secondary | ICD-10-CM | POA: Diagnosis not present

## 2023-08-26 DIAGNOSIS — M48061 Spinal stenosis, lumbar region without neurogenic claudication: Secondary | ICD-10-CM | POA: Diagnosis not present

## 2023-08-26 DIAGNOSIS — G894 Chronic pain syndrome: Secondary | ICD-10-CM | POA: Diagnosis not present

## 2023-08-26 DIAGNOSIS — Z79891 Long term (current) use of opiate analgesic: Secondary | ICD-10-CM | POA: Diagnosis not present

## 2023-11-26 DIAGNOSIS — Z79891 Long term (current) use of opiate analgesic: Secondary | ICD-10-CM | POA: Diagnosis not present

## 2023-11-26 DIAGNOSIS — M47816 Spondylosis without myelopathy or radiculopathy, lumbar region: Secondary | ICD-10-CM | POA: Diagnosis not present

## 2023-11-26 DIAGNOSIS — M48061 Spinal stenosis, lumbar region without neurogenic claudication: Secondary | ICD-10-CM | POA: Diagnosis not present

## 2023-11-26 DIAGNOSIS — G894 Chronic pain syndrome: Secondary | ICD-10-CM | POA: Diagnosis not present

## 2024-02-24 DIAGNOSIS — G894 Chronic pain syndrome: Secondary | ICD-10-CM | POA: Diagnosis not present

## 2024-02-24 DIAGNOSIS — Z5181 Encounter for therapeutic drug level monitoring: Secondary | ICD-10-CM | POA: Diagnosis not present

## 2024-02-24 DIAGNOSIS — M47816 Spondylosis without myelopathy or radiculopathy, lumbar region: Secondary | ICD-10-CM | POA: Diagnosis not present

## 2024-02-24 DIAGNOSIS — M48061 Spinal stenosis, lumbar region without neurogenic claudication: Secondary | ICD-10-CM | POA: Diagnosis not present

## 2024-02-24 DIAGNOSIS — Z79891 Long term (current) use of opiate analgesic: Secondary | ICD-10-CM | POA: Diagnosis not present

## 2024-03-16 DIAGNOSIS — M48061 Spinal stenosis, lumbar region without neurogenic claudication: Secondary | ICD-10-CM | POA: Diagnosis not present

## 2024-03-16 DIAGNOSIS — M47816 Spondylosis without myelopathy or radiculopathy, lumbar region: Secondary | ICD-10-CM | POA: Diagnosis not present

## 2024-05-25 DIAGNOSIS — Z79891 Long term (current) use of opiate analgesic: Secondary | ICD-10-CM | POA: Diagnosis not present

## 2024-05-25 DIAGNOSIS — Z5181 Encounter for therapeutic drug level monitoring: Secondary | ICD-10-CM | POA: Diagnosis not present

## 2024-05-25 DIAGNOSIS — M47816 Spondylosis without myelopathy or radiculopathy, lumbar region: Secondary | ICD-10-CM | POA: Diagnosis not present

## 2024-05-25 DIAGNOSIS — G894 Chronic pain syndrome: Secondary | ICD-10-CM | POA: Diagnosis not present

## 2024-05-25 DIAGNOSIS — M48061 Spinal stenosis, lumbar region without neurogenic claudication: Secondary | ICD-10-CM | POA: Diagnosis not present

## 2024-08-31 DIAGNOSIS — M47816 Spondylosis without myelopathy or radiculopathy, lumbar region: Secondary | ICD-10-CM | POA: Diagnosis not present

## 2024-08-31 DIAGNOSIS — Z79891 Long term (current) use of opiate analgesic: Secondary | ICD-10-CM | POA: Diagnosis not present

## 2024-08-31 DIAGNOSIS — M5136 Other intervertebral disc degeneration, lumbar region with discogenic back pain only: Secondary | ICD-10-CM | POA: Diagnosis not present

## 2024-08-31 DIAGNOSIS — M5416 Radiculopathy, lumbar region: Secondary | ICD-10-CM | POA: Diagnosis not present

## 2024-08-31 DIAGNOSIS — M48062 Spinal stenosis, lumbar region with neurogenic claudication: Secondary | ICD-10-CM | POA: Diagnosis not present

## 2024-08-31 DIAGNOSIS — G894 Chronic pain syndrome: Secondary | ICD-10-CM | POA: Diagnosis not present

## 2024-09-28 DIAGNOSIS — M4726 Other spondylosis with radiculopathy, lumbar region: Secondary | ICD-10-CM | POA: Diagnosis not present

## 2024-09-28 DIAGNOSIS — M48061 Spinal stenosis, lumbar region without neurogenic claudication: Secondary | ICD-10-CM | POA: Diagnosis not present
